# Patient Record
Sex: Female | Born: 1985
Health system: Southern US, Community
[De-identification: ages and names within clinical notes are randomized; demographics above are authoritative.]

## PROBLEM LIST (undated history)

## (undated) DIAGNOSIS — I10 Essential (primary) hypertension: Secondary | ICD-10-CM

## (undated) DIAGNOSIS — E119 Type 2 diabetes mellitus without complications: Secondary | ICD-10-CM

## (undated) HISTORY — DX: Type 2 diabetes mellitus without complications: E11.9

## (undated) HISTORY — PX: APPENDECTOMY: SHX54

## (undated) HISTORY — DX: Essential (primary) hypertension: I10

---

## 2006-04-30 ENCOUNTER — Emergency Department (HOSPITAL_COMMUNITY): Admission: EM | Admit: 2006-04-30 | Discharge: 2006-04-30 | Payer: Self-pay | Admitting: Family Medicine

## 2008-03-24 ENCOUNTER — Emergency Department (HOSPITAL_COMMUNITY): Admission: EM | Admit: 2008-03-24 | Discharge: 2008-03-25 | Payer: Self-pay | Admitting: *Deleted

## 2008-03-30 ENCOUNTER — Emergency Department (HOSPITAL_COMMUNITY): Admission: EM | Admit: 2008-03-30 | Discharge: 2008-03-30 | Payer: Self-pay | Admitting: Family Medicine

## 2008-06-11 ENCOUNTER — Emergency Department (HOSPITAL_COMMUNITY): Admission: EM | Admit: 2008-06-11 | Discharge: 2008-06-11 | Payer: Self-pay | Admitting: Family Medicine

## 2008-09-16 ENCOUNTER — Emergency Department (HOSPITAL_COMMUNITY): Admission: EM | Admit: 2008-09-16 | Discharge: 2008-09-16 | Payer: Self-pay | Admitting: Family Medicine

## 2008-11-09 ENCOUNTER — Emergency Department (HOSPITAL_COMMUNITY): Admission: EM | Admit: 2008-11-09 | Discharge: 2008-11-09 | Payer: Self-pay | Admitting: Emergency Medicine

## 2009-07-23 ENCOUNTER — Ambulatory Visit: Payer: Self-pay | Admitting: Family Medicine

## 2009-07-23 DIAGNOSIS — I10 Essential (primary) hypertension: Secondary | ICD-10-CM | POA: Insufficient documentation

## 2009-07-23 DIAGNOSIS — S139XXA Sprain of joints and ligaments of unspecified parts of neck, initial encounter: Secondary | ICD-10-CM | POA: Insufficient documentation

## 2009-07-23 DIAGNOSIS — M25519 Pain in unspecified shoulder: Secondary | ICD-10-CM | POA: Insufficient documentation

## 2009-07-24 ENCOUNTER — Telehealth: Payer: Self-pay | Admitting: Family Medicine

## 2009-09-09 ENCOUNTER — Emergency Department (HOSPITAL_COMMUNITY): Admission: EM | Admit: 2009-09-09 | Discharge: 2009-09-09 | Payer: Self-pay | Admitting: Family Medicine

## 2010-03-16 ENCOUNTER — Ambulatory Visit (HOSPITAL_COMMUNITY)
Admission: RE | Admit: 2010-03-16 | Discharge: 2010-03-16 | Payer: Self-pay | Source: Home / Self Care | Attending: Obstetrics | Admitting: Obstetrics

## 2010-04-19 NOTE — Progress Notes (Signed)
Summary: Allergic to RX'd Med.  Phone Note From Pharmacy   Reason for Call: Allergy Alert Summary of Call: Pharnacy called and stated that pt. informed them she is allergic to Clyclobenzaprine that was RX'd yesterday. Please advise. Pharmacy # [phone] [facility]. Joanne Chars CMA  Jul 24, 2009 9:15 AM     New/Updated Medications: METHOCARBAMOL 500 MG TABS (METHOCARBAMOL) One-half to one tab by mouth hs Prescriptions: METHOCARBAMOL 500 MG TABS (METHOCARBAMOL) One-half to one tab by mouth hs  #10 (ten) x 3   Entered and Authorized by:   Donna Christen MD   Signed by:   Donna Christen MD on 07/24/2009   Method used:   Electronically to        [facility] [phone]* (retail)       [address]       [place], [place]  [address]       Ph: [phone]       Fax: [phone]   RxID:   [id number]  Discussed with patient; Rx sent. Donna Christen MD  Jul 24, 2009 9:33 AM

## 2010-04-19 NOTE — Assessment & Plan Note (Signed)
Summary: R Shoulder/back pain x 2 wks rm 1   Vital Signs:  Patient Profile:   25 Years Old Female CC:      Shoulder/back pain radiating down arm x  Height:     66 inches Weight:      201 pounds O2 Sat:      99 % O2 treatment:    Room Air Temp:     98.3 degrees F oral Pulse rate:   84 / minute Pulse rhythm:   regular Resp:     16 per minute BP sitting:   138 / 92  (right arm) Cuff size:   regular  Vitals Entered By: Areta Haber CMA (Jul 23, 2009 8:23 AM)                  Current Allergies (reviewed today): ! * VICODEN ! FLEXERIL    History of Present Illness Chief Complaint: Shoulder/back pain radiating down arm x  History of Present Illness: Subjective:  Patient complains of 2 week history of soreness that started in her right neck, gradually spreading to her right shoulder and right shoulder blade.  The pain is worse with movement.  No chest pain.  No cough or pleuritic pain.  She is a smoker.  She recalls no injury but admits that she is in school and carries a heavy book bag on her right shoulder (she has now switched to her left shoulder).  She is having difficulty sleeping.  She has had slight decrease in discomfort when taking ibuprofen.  Current Problems: NECK SPASM (ICD-847.0) SHOULDER PAIN, RIGHT (ICD-719.41) FAMILY HISTORY DIABETES 1ST DEGREE RELATIVE (ICD-V18.0) HYPERTENSION (ICD-401.9)   Current Meds TRIAMTERENE-HCTZ 37.5-25 MG TABS (TRIAMTERENE-HCTZ) 1 tab by mouth once daily NUVARING 0.12-0.015 MG/24HR RING (ETONOGESTREL-ETHINYL ESTRADIOL) 1 tab  by mouth once daily IBUPROFEN 200 MG TABS (IBUPROFEN) 4 tabs by mouth as needed for pain NAPROXEN 500 MG TABS (NAPROXEN) One by mouth two times a day pc CYCLOBENZAPRINE HCL 10 MG TABS (CYCLOBENZAPRINE HCL) One tab by mouth hs  REVIEW OF SYSTEMS Constitutional Symptoms      Denies fever, chills, night sweats, weight loss, weight gain, and fatigue.  Eyes       Denies change in vision, eye pain, eye  discharge, glasses, contact lenses, and eye surgery. Ear/Nose/Throat/Mouth       Denies hearing loss/aids, change in hearing, ear pain, ear discharge, dizziness, frequent runny nose, frequent nose bleeds, sinus problems, sore throat, hoarseness, and tooth pain or bleeding.  Respiratory       Denies dry cough, productive cough, wheezing, shortness of breath, asthma, bronchitis, and emphysema/COPD.  Cardiovascular       Denies murmurs, chest pain, and tires easily with exhertion.    Gastrointestinal       Denies stomach pain, nausea/vomiting, diarrhea, constipation, blood in bowel movements, and indigestion. Genitourniary       Denies painful urination, kidney stones, and loss of urinary control. Neurological       Denies paralysis, seizures, and fainting/blackouts. Musculoskeletal       Complains of decreased range of motion.      Denies muscle pain, joint pain, joint stiffness, redness, swelling, muscle weakness, and gout.      Comments: R Shoulder/back x 2 wks Skin       Denies bruising, unusual mles/lumps or sores, and hair/skin or nail changes.  Psych       Denies mood changes, temper/anger issues, anxiety/stress, speech problems, depression, and sleep problems. Other Comments: Pt  has not seen PCP for this.   Past History:  Past Medical History: Hypertension  Past Surgical History: Appendectomy  Family History: Family History Diabetes 1st degree relative Family History Hypertension Myocardiac  Social History: Single Current Smoker - < 1 pack/2 wks Alcohol use-no Drug use-no Regular exercise-no Smoking Status:  current Drug Use:  no Does Patient Exercise:  no   Objective:  No acute distress  Neck:  No adenopathy.  Full range of motion.  There is tenderness over right trapezius and sternocleidomastoid muscles. Lungs:  Clear to auscultation.  Breath sounds are equal.  Heart:  Regular rate and rhythm without murmurs, rubs, or gallops.  Back:  Distinct tenderness  along medial edge of right scapula Abdomen:  Nontender without masses or hepatosplenomegaly.  Bowel sounds are present.  No CVA or flank tenderness.  Right shoulder:  Has difficulty actively abducting above horizontal.  Has difficulty reaching behind to scratch back.  Has distinct tenderness over medial insertion of biceps tendon.  Distal neurovascular intact  X-ray right shoulder:  no acute findings Chest X-ray:  negative  Assessment New Problems: NECK SPASM (ICD-847.0) SHOULDER PAIN, RIGHT (ICD-719.41) FAMILY HISTORY DIABETES 1ST DEGREE RELATIVE (ICD-V18.0) HYPERTENSION (ICD-401.9)  RIGHT RHOMBOID MUSCLE STRAIN AND INFLAMMATION SUSPECT PRESENT SYMPTOMS ARE A RESULT OF CARRYING A HEAVY BOOK BAG ON RIGHT SHOULDER  Plan New Medications/Changes: CYCLOBENZAPRINE HCL 10 MG TABS (CYCLOBENZAPRINE HCL) One tab by mouth hs  #15 x 1, 07/23/2009, Donna Christen MD NAPROXEN 500 MG TABS (NAPROXEN) One by mouth two times a day pc  #20 x 1, 07/23/2009, Donna Christen MD  New Orders: T-Chest x-ray, 2 views [71020] T-DG Shoulder*R* [73030] New Patient Level III [99203] Planning Comments:   Recomend using a wheeled bag with handle to carry books (or at least a backpack with two shoulder straps) Begin Naproxen.  Muscle relaxant at bedtime.  Begin ice packs.  Begin rehab exercises ([facility] information and instruction patient handouts given)  If not improving two weeks, recommend followup visit with [facility].   The patient and/or caregiver has been counseled thoroughly with regard to medications prescribed including dosage, schedule, interactions, rationale for use, and possible side effects and they verbalize understanding.  Diagnoses and expected course of recovery discussed and will return if not improved as expected or if the condition worsens. Patient and/or caregiver verbalized understanding.  Prescriptions: CYCLOBENZAPRINE HCL 10 MG TABS (CYCLOBENZAPRINE HCL) One tab by  mouth hs  #15 x 1   Entered and Authorized by:   Donna Christen MD   Signed by:   Donna Christen MD on 07/23/2009   Method used:   Print then Give to Patient   RxID:   [id number] NAPROXEN 500 MG TABS (NAPROXEN) One by mouth two times a day pc  #20 x 1   Entered and Authorized by:   Donna Christen MD   Signed by:   Donna Christen MD on 07/23/2009   Method used:   Print then Give to Patient   RxID:   [id number]

## 2010-06-05 LAB — POCT URINALYSIS DIP (DEVICE)
Bilirubin Urine: NEGATIVE
Glucose, UA: NEGATIVE mg/dL
Hgb urine dipstick: NEGATIVE
Ketones, ur: NEGATIVE mg/dL
Protein, ur: NEGATIVE mg/dL
Urobilinogen, UA: 0.2 mg/dL (ref 0.0–1.0)

## 2010-06-05 LAB — WET PREP, GENITAL: Yeast Wet Prep HPF POC: NONE SEEN

## 2010-06-05 LAB — POCT PREGNANCY, URINE: Preg Test, Ur: NEGATIVE

## 2010-06-25 LAB — POCT URINALYSIS DIP (DEVICE)
Glucose, UA: NEGATIVE mg/dL
Hgb urine dipstick: NEGATIVE
Nitrite: NEGATIVE
Urobilinogen, UA: 0.2 mg/dL (ref 0.0–1.0)
pH: 7 (ref 5.0–8.0)

## 2010-06-25 LAB — GC/CHLAMYDIA PROBE AMP, GENITAL: GC Probe Amp, Genital: NEGATIVE

## 2010-06-25 LAB — WET PREP, GENITAL

## 2010-06-25 LAB — POCT PREGNANCY, URINE: Preg Test, Ur: NEGATIVE

## 2010-06-27 LAB — WET PREP, GENITAL
Clue Cells Wet Prep HPF POC: NONE SEEN
Yeast Wet Prep HPF POC: NONE SEEN

## 2010-06-27 LAB — GC/CHLAMYDIA PROBE AMP, GENITAL
Chlamydia, DNA Probe: NEGATIVE
GC Probe Amp, Genital: NEGATIVE

## 2010-06-27 LAB — POCT URINALYSIS DIP (DEVICE)
Bilirubin Urine: NEGATIVE
Protein, ur: NEGATIVE mg/dL
pH: 6 (ref 5.0–8.0)

## 2010-06-27 LAB — POCT PREGNANCY, URINE: Preg Test, Ur: NEGATIVE

## 2010-06-30 LAB — POCT URINALYSIS DIP (DEVICE)
Hgb urine dipstick: NEGATIVE
Protein, ur: NEGATIVE mg/dL
Urobilinogen, UA: 0.2 mg/dL (ref 0.0–1.0)
pH: 7 (ref 5.0–8.0)

## 2010-06-30 LAB — WET PREP, GENITAL: Trich, Wet Prep: NONE SEEN

## 2010-06-30 LAB — GC/CHLAMYDIA PROBE AMP, GENITAL
Chlamydia, DNA Probe: NEGATIVE
GC Probe Amp, Genital: NEGATIVE

## 2010-07-04 LAB — POCT I-STAT, CHEM 8
BUN: 5 mg/dL — ABNORMAL LOW (ref 6–23)
Calcium, Ion: 1.12 mmol/L (ref 1.12–1.32)
HCT: 40 % (ref 36.0–46.0)
Sodium: 143 mEq/L (ref 135–145)
TCO2: 21 mmol/L (ref 0–100)

## 2010-07-04 LAB — URINE MICROSCOPIC-ADD ON

## 2010-07-04 LAB — URINALYSIS, ROUTINE W REFLEX MICROSCOPIC
Glucose, UA: NEGATIVE mg/dL
Protein, ur: 30 mg/dL — AB

## 2010-09-07 ENCOUNTER — Encounter: Payer: Managed Care, Other (non HMO) | Attending: Family Medicine | Admitting: *Deleted

## 2010-09-07 DIAGNOSIS — Z713 Dietary counseling and surveillance: Secondary | ICD-10-CM | POA: Insufficient documentation

## 2010-09-07 DIAGNOSIS — E669 Obesity, unspecified: Secondary | ICD-10-CM | POA: Insufficient documentation

## 2010-09-07 DIAGNOSIS — I10 Essential (primary) hypertension: Secondary | ICD-10-CM | POA: Insufficient documentation

## 2010-10-10 ENCOUNTER — Ambulatory Visit: Payer: Self-pay | Admitting: *Deleted

## 2011-07-13 DIAGNOSIS — F988 Other specified behavioral and emotional disorders with onset usually occurring in childhood and adolescence: Secondary | ICD-10-CM | POA: Insufficient documentation

## 2011-07-27 DIAGNOSIS — E669 Obesity, unspecified: Secondary | ICD-10-CM | POA: Insufficient documentation

## 2011-07-27 DIAGNOSIS — G43109 Migraine with aura, not intractable, without status migrainosus: Secondary | ICD-10-CM | POA: Insufficient documentation

## 2011-09-30 ENCOUNTER — Other Ambulatory Visit: Payer: Self-pay | Admitting: Obstetrics

## 2012-10-15 ENCOUNTER — Encounter: Payer: Self-pay | Admitting: Obstetrics

## 2013-12-22 DIAGNOSIS — F5101 Primary insomnia: Secondary | ICD-10-CM | POA: Insufficient documentation

## 2014-02-03 DIAGNOSIS — E119 Type 2 diabetes mellitus without complications: Secondary | ICD-10-CM | POA: Insufficient documentation

## 2015-03-19 DIAGNOSIS — Z789 Other specified health status: Secondary | ICD-10-CM | POA: Insufficient documentation

## 2016-05-03 DIAGNOSIS — E669 Obesity, unspecified: Secondary | ICD-10-CM | POA: Diagnosis not present

## 2016-05-03 DIAGNOSIS — E119 Type 2 diabetes mellitus without complications: Secondary | ICD-10-CM | POA: Diagnosis not present

## 2016-05-03 MED FILL — GLIMEPIRIDE 2 MG TABLET: 2 | 90 days supply | Qty: 90 | Fill #0

## 2016-05-19 MED FILL — BELVIQ 10 MG TABLET: 10 | 30 days supply | Qty: 60 | Fill #0

## 2016-05-23 DIAGNOSIS — Z6835 Body mass index (BMI) 35.0-35.9, adult: Secondary | ICD-10-CM | POA: Diagnosis not present

## 2016-05-23 DIAGNOSIS — R87612 Low grade squamous intraepithelial lesion on cytologic smear of cervix (LGSIL): Secondary | ICD-10-CM | POA: Diagnosis not present

## 2016-05-23 DIAGNOSIS — Z01419 Encounter for gynecological examination (general) (routine) without abnormal findings: Secondary | ICD-10-CM | POA: Diagnosis not present

## 2016-05-24 MED FILL — HYDROCHLOROTHIAZIDE 25 MG T: 25 | 90 days supply | Qty: 90 | Fill #0

## 2016-05-24 MED FILL — METFORMIN HCL ER 500 MG TAB: 500 | 90 days supply | Qty: 180 | Fill #0

## 2016-05-24 MED FILL — PROPRANOLOL ER 120 MG CAP: 120 | 30 days supply | Qty: 30 | Fill #0

## 2016-05-24 MED FILL — BUTALB-ACETAMIN-CAFF 50-325: 50-325-40 | 7 days supply | Qty: 30 | Fill #0

## 2016-05-24 MED FILL — BENAZEPRIL HCL 5 MG TABLET: 5 | 90 days supply | Qty: 90 | Fill #0

## 2016-06-21 MED FILL — PROPRANOLOL ER 120 MG CAP: 120 | 30 days supply | Qty: 30 | Fill #1

## 2016-06-23 MED FILL — BELVIQ 10 MG TABLET: 10 | 30 days supply | Qty: 60 | Fill #0

## 2016-07-06 DIAGNOSIS — N898 Other specified noninflammatory disorders of vagina: Secondary | ICD-10-CM | POA: Diagnosis not present

## 2016-07-06 DIAGNOSIS — N760 Acute vaginitis: Secondary | ICD-10-CM | POA: Diagnosis not present

## 2016-07-06 DIAGNOSIS — B9689 Other specified bacterial agents as the cause of diseases classified elsewhere: Secondary | ICD-10-CM | POA: Diagnosis not present

## 2016-07-06 DIAGNOSIS — N920 Excessive and frequent menstruation with regular cycle: Secondary | ICD-10-CM | POA: Diagnosis not present

## 2016-07-06 DIAGNOSIS — R109 Unspecified abdominal pain: Secondary | ICD-10-CM | POA: Diagnosis not present

## 2016-07-06 DIAGNOSIS — R1032 Left lower quadrant pain: Secondary | ICD-10-CM | POA: Diagnosis not present

## 2016-07-26 MED FILL — GLIMEPIRIDE 2 MG TABLET: 2 | 90 days supply | Qty: 90 | Fill #0

## 2016-07-27 MED FILL — HYDROCHLOROTHIAZIDE 25 MG T: 25 | 90 days supply | Qty: 90 | Fill #0

## 2016-07-27 MED FILL — PROPRANOLOL ER 120 MG CAP: 120 | 30 days supply | Qty: 30 | Fill #2

## 2016-07-31 DIAGNOSIS — I10 Essential (primary) hypertension: Secondary | ICD-10-CM | POA: Diagnosis not present

## 2016-07-31 DIAGNOSIS — E669 Obesity, unspecified: Secondary | ICD-10-CM | POA: Diagnosis not present

## 2016-07-31 DIAGNOSIS — R5383 Other fatigue: Secondary | ICD-10-CM | POA: Diagnosis not present

## 2016-07-31 DIAGNOSIS — E119 Type 2 diabetes mellitus without complications: Secondary | ICD-10-CM | POA: Diagnosis not present

## 2016-07-31 MED FILL — IBUPROFEN 800 MG TABLET: 800 | 30 days supply | Qty: 90 | Fill #0

## 2016-07-31 MED FILL — BELVIQ 10 MG TABLET: 10 | 30 days supply | Qty: 60 | Fill #0

## 2016-08-01 MED FILL — BUTALB-ACETAMIN-CAFF 50-325: 50-325-40 | 7 days supply | Qty: 30 | Fill #0

## 2016-08-29 MED FILL — PROPRANOLOL ER 120 MG CAP: 120 | 60 days supply | Qty: 60 | Fill #0

## 2016-09-04 ENCOUNTER — Ambulatory Visit: Payer: Managed Care, Other (non HMO) | Admitting: Osteopathic Medicine

## 2016-09-12 ENCOUNTER — Ambulatory Visit (INDEPENDENT_AMBULATORY_CARE_PROVIDER_SITE_OTHER): Payer: 59 | Admitting: Osteopathic Medicine

## 2016-09-12 ENCOUNTER — Encounter: Payer: Self-pay | Admitting: Osteopathic Medicine

## 2016-09-12 VITALS — BP 122/65 | HR 70 | Wt 223.0 lb

## 2016-09-12 DIAGNOSIS — I10 Essential (primary) hypertension: Secondary | ICD-10-CM

## 2016-09-12 DIAGNOSIS — J45990 Exercise induced bronchospasm: Secondary | ICD-10-CM

## 2016-09-12 DIAGNOSIS — E119 Type 2 diabetes mellitus without complications: Secondary | ICD-10-CM | POA: Diagnosis not present

## 2016-09-12 DIAGNOSIS — H52223 Regular astigmatism, bilateral: Secondary | ICD-10-CM | POA: Diagnosis not present

## 2016-09-12 DIAGNOSIS — Z6835 Body mass index (BMI) 35.0-35.9, adult: Secondary | ICD-10-CM

## 2016-09-12 DIAGNOSIS — G43809 Other migraine, not intractable, without status migrainosus: Secondary | ICD-10-CM | POA: Diagnosis not present

## 2016-09-12 MED ORDER — ALBUTEROL SULFATE HFA 108 (90 BASE) MCG/ACT IN AERS: 2.0000 | INHALATION_SPRAY | Freq: Four times a day (QID) | RESPIRATORY_TRACT | 11 refills | Status: DC | PRN

## 2016-09-12 MED ORDER — PROPRANOLOL HCL ER 80 MG PO CP24: 80.0000 mg | ORAL_CAPSULE | Freq: Every day | ORAL | 1 refills | Status: DC

## 2016-09-12 MED ORDER — METFORMIN HCL ER 500 MG PO TB24: 500.0000 mg | ORAL_TABLET | Freq: Every day | ORAL | 11 refills | Status: DC

## 2016-09-12 MED FILL — VENTOLIN HFA 90 MCG INHALER: 108 (90 BAS | 50 days supply | Qty: 36 | Fill #0

## 2016-09-12 MED FILL — PROPRANOLOL ER 80 MG CAP: 80 | 30 days supply | Qty: 30 | Fill #0 | Status: TO

## 2016-09-12 NOTE — Progress Notes (Signed)
HPI: Brittany Anderson is a 31 y.o. female  who presents to [facility] today, 09/12/16,  for chief complaint of:  Chief Complaint  Patient presents with  . Establish Care    DM2: diagnosed 2 years ago, Recent A1c is at goal, would like to get off some medications if possible. Has been diligent about lifestyle/dietary modifications but still struggles with weight loss.  Migraine: Has been on relatively high-dose propranolol for prevention, using Fioricet as needed, has tried Topamax in the past but had side effects from this medication.  Essential hypertension: On benazepril and hydrochlorothiazide, propranolol as above but mostly for migraine prophylaxis. She would like to try to get off of some of these medications if possible.  Dyspnea on exertion: Typically not bothersome but occasionally with strenuous workouts will notice some shortness of breath. No chest pain, no palpitations, no orthopnea or lower extremity swelling    Past medical, surgical, social and family history reviewed: Patient Active Problem List   Diagnosis Date Noted  . HYPERTENSION 07/23/2009  . SHOULDER PAIN, RIGHT 07/23/2009  . NECK SPASM 07/23/2009   No past surgical history on file. Social History  Substance Use Topics  . Smoking status: Not on file  . Smokeless tobacco: Not on file  . Alcohol use Not on file   No family history on file.   Current medication list and allergy/intolerance information reviewed:   Current Outpatient Prescriptions  Medication Sig Dispense Refill  . ALPRAZolam (XANAX) 0.5 MG tablet Take 0.5 mg by mouth.    . benazepril (LOTENSIN) 5 MG tablet Take 5 mg by mouth.    . metFORMIN (GLUCOPHAGE-XR) 500 MG 24 hr tablet Take 1,000 mg by mouth.    . ALPRAZolam (XANAX) 0.5 MG tablet     . BELVIQ 10 MG TABS     . butalbital-acetaminophen-caffeine (FIORICET, ESGIC) 50-325-40 MG tablet     . glimepiride (AMARYL) 2 MG tablet   0  .  hydrochlorothiazide (HYDRODIURIL) 25 MG tablet   0  . ibuprofen (ADVIL,MOTRIN) 800 MG tablet     . Levonorgestrel 13.5 MG IUD by Intrauterine route.    . propranolol ER (INDERAL LA) 120 MG 24 hr capsule   1   No current facility-administered medications for this visit.    Allergies  Allergen Reactions  . Cyclobenzaprine Hcl     REACTION: itching  . Hydrocodone-Acetaminophen     REACTION: itching  . Hydrocodone-Ibuprofen Itching      Review of Systems:  Constitutional:  No  fever, no chills, No recent illness, No unintentional weight changes. No significant fatigue.   HEENT: No  headache, no vision change  Cardiac: No  chest pain, No  pressure, No palpitations, No  Orthopnea   Respiratory:  No  shortness of breath at this time. No  Cough  Gastrointestinal: No  abdominal pain, No  nausea, No  vomiting,  Musculoskeletal: No new myalgia/arthralgia  Skin: No  Rash  Endocrine: No cold intolerance,  No heat intolerance. No polyuria/polydipsia/polyphagia   Neurologic: No  weakness, No  dizziness,  Psychiatric: No  concerns with depression, No  concerns with anxiety, No sleep problems, No mood problems  Exam:  BP 122/65   Pulse 70   Wt 223 lb (101.2 kg)   BMI 35.99 kg/m   Constitutional: VS see above. General Appearance: alert, well-developed, well-nourished, NAD  Eyes: Normal lids and conjunctive, non-icteric sclera  Ears, Nose, Mouth, Throat: MMM, Normal external inspection ears/nares/mouth/lips/gums.   Neck: No  masses, trachea midline. No thyroid enlargement. No tenderness/mass appreciated. No lymphadenopathy  Respiratory: Normal respiratory effort. no wheeze, no rhonchi, no rales  Cardiovascular: S1/S2 normal, no murmur, no rub/gallop auscultated. RRR. No lower extremity edema.   Musculoskeletal: Gait normal. No clubbing/cyanosis of digits.   Neurological: Normal balance/coordination. No tremor  Skin: warm, dry, intact.   Psychiatric: Normal  judgment/insight. Normal mood and affect. Oriented x3.   Labs reviewed [facility] system: 07/31/2016 No concerns on CMP, A1c was 6.0, down from 6.8 and 7.2 last year, TSH normal Lipid profile 06/10/2015: Elevated triglycerides but otherwise no concerns.   ASSESSMENT/PLAN:   Type 2 diabetes mellitus without complication, without long-term current use of insulin (HCC) - I think based on progress with A1c and weight loss, can de-escalate therapy and follow-up with A1c. Recommended continued use metformin per guidelines  Other migraine without status migrainosus, not intractable - Patient would like to try weaning of propranolol and follow-up with neurologist - Plan: propranolol ER (INDERAL LA) 80 MG 24 hr capsule, Ambulatory referral to Neurology  Essential hypertension - Continue current medications, monitor blood pressure closely since we are decreasing dose of propranolol  BMI 35.0-35.9,adult  Exercise-induced asthma - Trial albuterol prior to exercise, if no improvement, consider further workup.    Patient Instructions  Thanks for coming to see [place] today!  Plan:  Can stop the Amaryl  Can decrease the Metformin to 500 mg daily  Can decrease the Propranolol to 80 mg daily for a few weeks and stop  Try Albuterol prior to exercise   Let us know if you don't hear about neurology referral within one week  For refills - see if the pharmacy can route [place] a request, or call our office directly     Visit summary with medication list and pertinent instructions was printed for patient to review. All questions at time of visit were answered - patient instructed to contact office with any additional concerns. ER/RTC precautions were reviewed with the patient. Follow-up plan: Return for diabetes recheck after 11/01/16.

## 2016-09-12 NOTE — Patient Instructions (Addendum)
Thanks for coming to see Brittany Anderson today!  Plan:  Can stop the Amaryl  Can decrease the Metformin to 500 mg daily  Can decrease the Propranolol to 80 mg daily for a few weeks and stop  Try Albuterol prior to exercise   Let Brittany Anderson know if you don't hear about neurology referral within one week  For refills - see if the pharmacy can route Brittany Anderson a request, or call our office directly

## 2016-09-13 DIAGNOSIS — Z6835 Body mass index (BMI) 35.0-35.9, adult: Secondary | ICD-10-CM | POA: Insufficient documentation

## 2016-09-13 DIAGNOSIS — J45990 Exercise induced bronchospasm: Secondary | ICD-10-CM | POA: Insufficient documentation

## 2016-09-21 ENCOUNTER — Other Ambulatory Visit: Payer: Self-pay

## 2016-09-21 ENCOUNTER — Telehealth: Payer: Self-pay

## 2016-09-21 MED ORDER — METFORMIN HCL ER 500 MG PO TB24: 500.0000 mg | ORAL_TABLET | Freq: Every day | ORAL | 5 refills | Status: DC

## 2016-09-21 NOTE — Telephone Encounter (Signed)
Pt called and stated that her refill for metformin 500 mg was not sent to the pharmacy a week ago. Called pharmacy and verified that they did not receive refill that was sent on 09/12/16. Changed Rx to 5 refills per Dr. Lyn HollingsheadAlexander. Pt also wants to know if she can get a refill on her xanax 0.5 mg? Prescription was previously prescribed by another provider. Please Advise?

## 2016-09-22 MED ORDER — ALPRAZOLAM 0.5 MG PO TABS: 0.5000 mg | ORAL_TABLET | Freq: Every day | ORAL | 0 refills | Status: DC | PRN

## 2016-09-22 NOTE — Telephone Encounter (Signed)
Rx faxed to [facility] per Dr. Lyn HollingsheadAlexander.

## 2016-09-22 NOTE — Telephone Encounter (Signed)
LVM requesting pt to call the office.  

## 2016-09-22 NOTE — Telephone Encounter (Signed)
We did not discuss this medication in detail at her most recent visit. Okay to send refills for now but will discuss this medication more at her follow-up visit since this is typically not something that I prescribe for patients to be using on a daily basis. Please confirm pharmacy with patient and we can fax the medicine, it is in my medical assistants in basket

## 2016-09-22 NOTE — Telephone Encounter (Signed)
Pt informed. Pt expressed understanding and is agreeable. [facility] Canc[facility] Rx at [facility] and [facility]. Liborio NixonAmanda Marvelle Span CMA, RT

## 2016-09-26 ENCOUNTER — Emergency Department
Admission: EM | Admit: 2016-09-26 | Discharge: 2016-09-26 | Discharge status: Absent without leave | Payer: Self-pay | Source: Home / Self Care

## 2016-09-28 ENCOUNTER — Encounter (HOSPITAL_BASED_OUTPATIENT_CLINIC_OR_DEPARTMENT_OTHER): Payer: Self-pay | Admitting: *Deleted

## 2016-09-28 ENCOUNTER — Emergency Department (HOSPITAL_BASED_OUTPATIENT_CLINIC_OR_DEPARTMENT_OTHER)
Admission: EM | Admit: 2016-09-28 | Discharge: 2016-09-28 | Disposition: A | Discharge status: Home / Self Care | Payer: 59 | Attending: Emergency Medicine | Admitting: Emergency Medicine

## 2016-09-28 ENCOUNTER — Emergency Department (HOSPITAL_BASED_OUTPATIENT_CLINIC_OR_DEPARTMENT_OTHER): Payer: 59

## 2016-09-28 DIAGNOSIS — Z7984 Long term (current) use of oral hypoglycemic drugs: Secondary | ICD-10-CM | POA: Insufficient documentation

## 2016-09-28 DIAGNOSIS — J45909 Unspecified asthma, uncomplicated: Secondary | ICD-10-CM | POA: Insufficient documentation

## 2016-09-28 DIAGNOSIS — Y999 Unspecified external cause status: Secondary | ICD-10-CM | POA: Insufficient documentation

## 2016-09-28 DIAGNOSIS — I10 Essential (primary) hypertension: Secondary | ICD-10-CM | POA: Diagnosis not present

## 2016-09-28 DIAGNOSIS — Y929 Unspecified place or not applicable: Secondary | ICD-10-CM | POA: Insufficient documentation

## 2016-09-28 DIAGNOSIS — M79642 Pain in left hand: Secondary | ICD-10-CM | POA: Diagnosis not present

## 2016-09-28 DIAGNOSIS — E119 Type 2 diabetes mellitus without complications: Secondary | ICD-10-CM | POA: Diagnosis not present

## 2016-09-28 DIAGNOSIS — Z87891 Personal history of nicotine dependence: Secondary | ICD-10-CM | POA: Insufficient documentation

## 2016-09-28 DIAGNOSIS — S6992XA Unspecified injury of left wrist, hand and finger(s), initial encounter: Secondary | ICD-10-CM | POA: Diagnosis not present

## 2016-09-28 DIAGNOSIS — Z79899 Other long term (current) drug therapy: Secondary | ICD-10-CM | POA: Insufficient documentation

## 2016-09-28 DIAGNOSIS — Y939 Activity, unspecified: Secondary | ICD-10-CM | POA: Insufficient documentation

## 2016-09-28 DIAGNOSIS — R52 Pain, unspecified: Secondary | ICD-10-CM

## 2016-09-28 MED FILL — METFORMIN HCL ER 500 MG TAB: 500 | 30 days supply | Qty: 30 | Fill #0
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
## 2016-09-28 NOTE — ED Notes (Signed)
Patient was seen and assessed prior to this RN seeing patient. When patient in to assess the patient she states that she needs to go to the pharmacy to get her meds. Patient to return. A/O x 3 - no distress noted
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
## 2016-09-28 NOTE — Discharge Instructions (Signed)
Your x-ray does not show fracture. Please take ibuprofen and Tylenol as needed for pain.

## 2016-09-29 ENCOUNTER — Telehealth: Payer: Self-pay | Admitting: Osteopathic Medicine

## 2016-09-29 NOTE — Telephone Encounter (Signed)
Paperwork completed and placed in fax box.

## 2016-10-01 ENCOUNTER — Emergency Department (INDEPENDENT_AMBULATORY_CARE_PROVIDER_SITE_OTHER)
Admission: EM | Admit: 2016-10-01 | Discharge: 2016-10-01 | Disposition: A | Discharge status: Home / Self Care | Payer: 59 | Source: Home / Self Care | Attending: Family Medicine | Admitting: Family Medicine

## 2016-10-01 ENCOUNTER — Emergency Department (INDEPENDENT_AMBULATORY_CARE_PROVIDER_SITE_OTHER): Payer: 59

## 2016-10-01 ENCOUNTER — Encounter: Payer: Self-pay | Admitting: Emergency Medicine

## 2016-10-01 DIAGNOSIS — S59902A Unspecified injury of left elbow, initial encounter: Secondary | ICD-10-CM | POA: Diagnosis not present

## 2016-10-01 DIAGNOSIS — M25531 Pain in right wrist: Secondary | ICD-10-CM | POA: Diagnosis not present

## 2016-10-01 DIAGNOSIS — M25532 Pain in left wrist: Secondary | ICD-10-CM | POA: Diagnosis not present

## 2016-10-01 DIAGNOSIS — S6992XA Unspecified injury of left wrist, hand and finger(s), initial encounter: Secondary | ICD-10-CM | POA: Diagnosis not present

## 2016-10-01 DIAGNOSIS — M79642 Pain in left hand: Secondary | ICD-10-CM | POA: Diagnosis not present

## 2016-10-01 DIAGNOSIS — M25522 Pain in left elbow: Secondary | ICD-10-CM | POA: Diagnosis not present

## 2016-10-01 DIAGNOSIS — S63502D Unspecified sprain of left wrist, subsequent encounter: Secondary | ICD-10-CM
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
## 2016-10-01 NOTE — Discharge Instructions (Signed)
°  You may take 500mg  acetaminophen every 4-6 hours or in combination with ibuprofen 400-600mg  every 6-8 hours as needed for swelling and pain.  Avoid over use of Left hand/wrist and avoid heavy lifting until pain has resolved.  It is recommended that you were your wrist splint at all times except to bath, wash hands or ice your hand/wrist 2-3 times daily for 15-20 minutes at a time.  You may do gentle range of motion exercises as tolerated. Avoid grasp, lifting, pulling, or carrying with Left hand. Avoid zip-lining rope climbing or rope swinging if still having pain as this may worsen your wrist sprain.   If still having pain in 1-2 weeks, please call to schedule a follow up appointment with Sports Medicine, Dr. Benjamin Stainhekkekandam or Dr. Denyse Amassorey, in the same office as your primary care provider, Dr. Lyn HollingsheadAlexander as they may order an MRI if they feel it is indicated at that time.

## 2016-10-01 NOTE — ED Notes (Signed)
Thumb Spica Splint applied to left wrist, discharge instructions discussed with patient. She was concerned due to leaving for a cruise tomorrow. She states she is supposed to zip line on this trip. Patient was encouraged to follow all discharge instructions. She is also to take Tylenol/Ibuprofen on a regular regimen. Follow RICE,

## 2016-10-01 NOTE — ED Notes (Signed)
Pt escorted to xray.

## 2016-10-10 ENCOUNTER — Ambulatory Visit (INDEPENDENT_AMBULATORY_CARE_PROVIDER_SITE_OTHER): Payer: 59 | Admitting: Family Medicine

## 2016-10-10 ENCOUNTER — Encounter: Payer: Self-pay | Admitting: Family Medicine

## 2016-10-10 VITALS — BP 140/81 | HR 87 | Wt 233.0 lb

## 2016-10-10 DIAGNOSIS — S6992XA Unspecified injury of left wrist, hand and finger(s), initial encounter: Secondary | ICD-10-CM | POA: Diagnosis not present

## 2016-10-10 MED ORDER — DICLOFENAC SODIUM 1 % TD GEL: 2.0000 g | Freq: Four times a day (QID) | TRANSDERMAL | 11 refills | Status: DC

## 2016-10-10 NOTE — Patient Instructions (Addendum)
Thank you for coming in today. Use the brace as needed.  Attend Hand Therapy.   Use Voltaren Gel as needed up tp 4x daily (q6h).  Recheck in 3-4 weeks if not better.
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
## 2016-10-12 ENCOUNTER — Institutional Professional Consult (permissible substitution): Payer: 59 | Admitting: Family Medicine

## 2016-10-13 MED FILL — PROPRANOLOL ER 80 MG CAP: 80 | 30 days supply | Qty: 30 | Fill #0

## 2016-10-19 ENCOUNTER — Ambulatory Visit: Payer: Managed Care, Other (non HMO) | Admitting: Family Medicine

## 2016-11-02 ENCOUNTER — Encounter: Payer: Self-pay | Admitting: Physician Assistant

## 2016-11-02 ENCOUNTER — Ambulatory Visit (INDEPENDENT_AMBULATORY_CARE_PROVIDER_SITE_OTHER): Payer: 59 | Admitting: Physician Assistant

## 2016-11-02 ENCOUNTER — Ambulatory Visit: Payer: Managed Care, Other (non HMO) | Admitting: Osteopathic Medicine

## 2016-11-02 VITALS — BP 121/81 | HR 77 | Ht 65.5 in | Wt 233.0 lb

## 2016-11-02 DIAGNOSIS — I10 Essential (primary) hypertension: Secondary | ICD-10-CM | POA: Diagnosis not present

## 2016-11-02 DIAGNOSIS — F419 Anxiety disorder, unspecified: Secondary | ICD-10-CM

## 2016-11-02 DIAGNOSIS — Z13220 Encounter for screening for lipoid disorders: Secondary | ICD-10-CM

## 2016-11-02 DIAGNOSIS — G43809 Other migraine, not intractable, without status migrainosus: Secondary | ICD-10-CM

## 2016-11-02 DIAGNOSIS — Z136 Encounter for screening for cardiovascular disorders: Secondary | ICD-10-CM | POA: Diagnosis not present

## 2016-11-02 DIAGNOSIS — E119 Type 2 diabetes mellitus without complications: Secondary | ICD-10-CM | POA: Diagnosis not present

## 2016-11-02 LAB — POCT UA - MICROALBUMIN
Albumin/Creatinine Ratio, Urine, POC: <30
Creatinine, POC: 50 mg/dL
MICROALBUMIN (UR) POC: 10 mg/L

## 2016-11-02 LAB — POCT GLYCOSYLATED HEMOGLOBIN (HGB A1C): HEMOGLOBIN A1C: 8.1

## 2016-11-02 MED ORDER — METFORMIN HCL ER 500 MG PO TB24: 1000.0000 mg | ORAL_TABLET | Freq: Every day | ORAL | 0 refills | Status: DC

## 2016-11-02 MED ORDER — PROPRANOLOL HCL ER 80 MG PO CP24: 80.0000 mg | ORAL_CAPSULE | Freq: Every day | ORAL | 0 refills | Status: DC

## 2016-11-02 MED ORDER — ONDANSETRON 4 MG PO TBDP
4.0000 mg | ORAL_TABLET | Freq: Four times a day (QID) | ORAL | 0 refills | Status: DC | PRN
Start: 2016-11-02 — End: 2017-05-21

## 2016-11-02 MED ORDER — ALPRAZOLAM 0.5 MG PO TABS: 0.5000 mg | ORAL_TABLET | Freq: Every day | ORAL | 0 refills | Status: DC | PRN

## 2016-11-02 MED ORDER — HYDROCHLOROTHIAZIDE 25 MG PO TABS: 25.0000 mg | ORAL_TABLET | Freq: Every day | ORAL | 0 refills | Status: DC

## 2016-11-02 MED ORDER — LIRAGLUTIDE 18 MG/3ML ~~LOC~~ SOPN: PEN_INJECTOR | SUBCUTANEOUS | 1 refills | Status: DC

## 2016-11-02 MED FILL — HYDROCHLOROTHIAZIDE 25 MG T: 25 | 90 days supply | Qty: 90 | Fill #0

## 2016-11-02 MED FILL — ONDANSETRON ODT 4 MG TABLET: 4 | 4 days supply | Qty: 16 | Fill #0

## 2016-11-02 MED FILL — METFORMIN HCL ER 500 MG TAB: 500 | 90 days supply | Qty: 180 | Fill #0

## 2016-11-02 NOTE — Patient Instructions (Signed)
- Increase your Metformin to 2 tabs daily with breakfast or dinner - Start Victoza - Limit and count your carbohydrates - Increase physical activity to at least 3, 45 minute sessions of cardio per week - Return in 3 months     Carbohydrate Counting for Diabetes Mellitus, Adult Carbohydrate counting is a method for keeping track of how many carbohydrates you eat. Eating carbohydrates naturally increases the amount of sugar (glucose) in the blood. Counting how many carbohydrates you eat helps keep your blood glucose within normal limits, which helps you manage your diabetes (diabetes mellitus). It is important to know how many carbohydrates you can safely have in each meal. This is different for every person. A diet and nutrition specialist (registered dietitian) can help you make a meal plan and calculate how many carbohydrates you should have at each meal and snack. Carbohydrates are found in the following foods:  Grains, such as breads and cereals.  Dried beans and soy products.  Starchy vegetables, such as potatoes, peas, and corn.  Fruit and fruit juices.  Milk and yogurt.  Sweets and snack foods, such as cake, cookies, candy, chips, and soft drinks.  How do I count carbohydrates? There are two ways to count carbohydrates in food. You can use either of the methods or a combination of both. Reading "Nutrition Facts" on packaged food The "Nutrition Facts" list is included on the labels of almost all packaged foods and beverages in the [place] It includes:  The serving size.  Information about nutrients in each serving, including the grams (g) of carbohydrate per serving.  To use the "Nutrition Facts":  Decide how many servings you will have.  Multiply the number of servings by the number of carbohydrates per serving.  The resulting number is the total amount of carbohydrates that you will be having.  Learning standard serving sizes of other foods When you eat foods containing  carbohydrates that are not packaged or do not include "Nutrition Facts" on the label, you need to measure the servings in order to count the amount of carbohydrates:  Measure the foods that you will eat with a food scale or measuring cup, if needed.  Decide how many standard-size servings you will eat.  Multiply the number of servings by 15. Most carbohydrate-rich foods have about 15 g of carbohydrates per serving. ? For example, if you eat 8 oz (170 g) of strawberries, you will have eaten 2 servings and 30 g of carbohydrates (2 servings x 15 g = 30 g).  For foods that have more than one food mixed, such as soups and casseroles, you must count the carbohydrates in each food that is included.  The following list contains standard serving sizes of common carbohydrate-rich foods. Each of these servings has about 15 g of carbohydrates:   hamburger bun or  English muffin.   oz (15 mL) syrup.   oz (14 g) jelly.  1 slice of bread.  1 six-inch tortilla.  3 oz (85 g) cooked rice or pasta.  4 oz (113 g) cooked dried beans.  4 oz (113 g) starchy vegetable, such as peas, corn, or potatoes.  4 oz (113 g) hot cereal.  4 oz (113 g) mashed potatoes or  of a large baked potato.  4 oz (113 g) canned or frozen fruit.  4 oz (120 mL) fruit juice.  4-6 crackers.  6 chicken nuggets.  6 oz (170 g) unsweetened dry cereal.  6 oz (170 g) plain fat-free yogurt or  yogurt sweetened with artificial sweeteners.  8 oz (240 mL) milk.  8 oz (170 g) fresh fruit or one small piece of fruit.  24 oz (680 g) popped popcorn.  Example of carbohydrate counting Sample meal  3 oz (85 g) chicken breast.  6 oz (170 g) brown rice.  4 oz (113 g) corn.  8 oz (240 mL) milk.  8 oz (170 g) strawberries with sugar-free whipped topping. Carbohydrate calculation 1. Identify the foods that contain carbohydrates: ? Rice. ? Corn. ? Milk. ? Strawberries. 2. Calculate how many servings you have of each  food: ? 2 servings rice. ? 1 serving corn. ? 1 serving milk. ? 1 serving strawberries. 3. Multiply each number of servings by 15 g: ? 2 servings rice x 15 g = 30 g. ? 1 serving corn x 15 g = 15 g. ? 1 serving milk x 15 g = 15 g. ? 1 serving strawberries x 15 g = 15 g. 4. Add together all of the amounts to find the total grams of carbohydrates eaten: ? 30 g + 15 g + 15 g + 15 g = 75 g of carbohydrates total. This information is not intended to replace advice given to you by your health care provider. Make sure you discuss any questions you have with your health care provider. Document Released: 03/06/2005 Document Revised: 09/24/2015 Document Reviewed: 08/18/2015 Elsevier Interactive Patient Education  [employer]2018 [employer] Inc.

## 2016-11-02 NOTE — Progress Notes (Signed)
HPI:                                                                Brittany Anderson is a 31 y.o. female who [facility]: Primary Care Sports Medicine today for diabetes, hypertension and migraine follow-up  DMII: taking Glucophage XR 500mg  daily. Compliant with medications. Last A1C 6.0, 07/2016. Diagnosed in 2015. Does not check blood sugars at home.  Denies polyuria, vision change, and paresthesias. Denies hypoglycemic events. Denies ulcers/wounds on feet. [facility]  HTN: taking hydrochlorothiazde daily. Compliant with medications. Does not check BP's at home.Denies vision change, headache, chest pain with exertion, orthopnea, lightheadedness, syncope and edema. Risk factors include: obesity, diabetes  Migraine: doing well on Propranolol ER. Reports she has had more headaches since reducing the dose.   Past Medical History:  Diagnosis Date  . Diabetes mellitus without complication (HCC)   . Hypertension    Past Surgical History:  Procedure Laterality Date  . APPENDECTOMY     Social History  Substance Use Topics  . Smoking status: Former Smoker    Quit date: 2009  . Smokeless tobacco: Never Used  . Alcohol use Yes   family history includes Alcohol abuse in her maternal grandfather; Depression in her maternal grandfather and mother; Diabetes in her father and mother; Hypertension in her father, maternal grandmother, and mother; Stroke in her maternal grandfather.  ROS: negative except as noted in the HPI  Medications: Current Outpatient Prescriptions  Medication Sig Dispense Refill  . albuterol (PROVENTIL HFA;VENTOLIN HFA) 108 (90 Base) MCG/ACT inhaler Inhale 2 puffs into the lungs every 6 (six) hours as needed for wheezing or shortness of breath (and prior to exercise). 2 Inhaler 11  . ALPRAZolam (XANAX) 0.5 MG tablet Take 1 tablet (0.5 mg total) by mouth daily as needed for anxiety. #30 for thirty days 30 tablet 0   . butalbital-acetaminophen-caffeine (FIORICET, ESGIC) 50-325-40 MG tablet     . hydrochlorothiazide (HYDRODIURIL) 25 MG tablet   0  . ibuprofen (ADVIL,MOTRIN) 800 MG tablet     . metFORMIN (GLUCOPHAGE-XR) 500 MG 24 hr tablet Take 1 tablet (500 mg total) by mouth daily with breakfast. 30 tablet 5  . propranolol ER (INDERAL LA) 80 MG 24 hr capsule Take 1 capsule (80 mg total) by mouth daily. 30 capsule 1   No current facility-administered medications for this visit.    Allergies  Allergen Reactions  . Cyclobenzaprine Hcl     REACTION: itching  . Hydrocodone-Acetaminophen     REACTION: itching  . Hydrocodone-Ibuprofen Itching     Objective:  BP 121/81   Pulse 77   Ht 5' 5.5" (1.664 m)   Wt 233 lb (105.7 kg)   BMI 38.18 kg/m  Gen:  alert, not ill-appearing, no distress, appropriate for age HEENT: head normocephalic without obvious abnormality, conjunctiva and cornea clear, trachea midline Pulm: Normal work of breathing, normal phonation Cardio: normal rate, regular rhythm, s1 and s2 distinct, no murmur, click or rub Neuro: alert and oriented x 3, no tremor MSK: extremities atraumatic, normal gait and station, no peripheral edema Skin: intact, no rashes on exposed skin, no jaundice, no cyanosis Psych: well-groomed, cooperative, good eye contact, euthymic mood, affect mood-congruent, speech is  articulate, and thought processes clear and goal-directed    Results for orders placed or performed in visit on 11/02/16 (from the past 72 hour(s))  POCT HgB A1C     Status: Abnormal   Collection Time: 11/02/16  9:24 AM  Result Value Ref Range   Hemoglobin A1C 8.1    No results found.  GAD 7 : Generalized Anxiety Score 11/02/2016  Nervous, Anxious, on Edge 1  Control/stop worrying 0  Worry too much - different things 0  Trouble relaxing 3  Restless 0  Easily annoyed or irritable 1  Afraid - awful might happen 0  Total GAD 7 Score 5      Assessment and Plan: 31 y.o. female  with   1. Type 2 diabetes mellitus without complication, without long-term current use of insulin (HCC) - A1c 8.1, worsened from 6.0, 3 months ago - POCT UA - Microalbumin normal - increasing Metformin XR to 1000mg . Adding Liraglutide. Follow-up in 3 months - metFORMIN (GLUCOPHAGE-XR) 500 MG 24 hr tablet; Take 2 tablets (1,000 mg total) by mouth daily with breakfast.  Dispense: 180 tablet; Refill: 0 - liraglutide 18 MG/3ML SOPN; 0.6 mg SQ once daily for 1 week; then increase to 1.2 mg SQ once daily  Dispense: 4 pen; Refill: 1 - ondansetron (ZOFRAN-ODT) 4 MG disintegrating tablet; Take 1 tablet (4 mg total) by mouth every 6 (six) hours as needed for nausea or vomiting.  Dispense: 16 tablet; Refill: 0 - Lipid Panel w/reflex Direct LDL - Comprehensive metabolic panel   2. Other migraine without status migrainosus, not intractable - propranolol ER (INDERAL LA) 80 MG 24 hr capsule; Take 1 capsule (80 mg total) by mouth daily.  Dispense: 90 capsule; Refill: 0  3. Anxiety disorder, unspecified type - GAD7 score 5, mild - ALPRAZolam (XANAX) 0.5 MG tablet; Take 1 tablet (0.5 mg total) by mouth daily as needed for anxiety. #30 for thirty days  Dispense: 30 tablet; Refill: 0  4. Hypertension goal BP (blood pressure) < 130/80 BP Readings from Last 3 Encounters:  11/02/16 121/81  10/10/16 140/81  10/01/16 (!) 141/92  - BP in range today. Continue daily meds - therapeutic lifestyle changes - hydrochlorothiazide (HYDRODIURIL) 25 MG tablet; Take 1 tablet (25 mg total) by mouth daily.  Dispense: 90 tablet; Refill: 0  5. Encounter for lipid screening for cardiovascular disease - goal LDL <70 for diabetic - Lipid Panel w/reflex Direct LDL   Patient education and anticipatory guidance given Patient agrees with treatment plan Follow-up in 3 months or sooner as needed if symptoms worsen or fail to improve  Levonne Hubert PA-C

## 2016-11-03 MED FILL — VICTOZA 18 MG/3 ML INJECT P: 18 | 90 days supply | Qty: 18 | Fill #0

## 2016-11-05 DIAGNOSIS — F419 Anxiety disorder, unspecified: Secondary | ICD-10-CM | POA: Insufficient documentation

## 2016-11-10 MED FILL — UNIFINE PENTIPS 6MM 31G: 31G X 6 MM | 90 days supply | Qty: 100 | Fill #0

## 2016-11-10 MED FILL — PROPRANOLOL ER 80 MG CAP: 80 | 90 days supply | Qty: 90 | Fill #0

## 2016-11-29 ENCOUNTER — Ambulatory Visit: Payer: Self-pay | Admitting: Neurology

## 2016-12-03 ENCOUNTER — Other Ambulatory Visit: Payer: Self-pay | Admitting: Physician Assistant

## 2016-12-03 DIAGNOSIS — F419 Anxiety disorder, unspecified: Secondary | ICD-10-CM

## 2017-01-08 ENCOUNTER — Other Ambulatory Visit: Payer: Self-pay | Admitting: Osteopathic Medicine

## 2017-01-08 DIAGNOSIS — F419 Anxiety disorder, unspecified: Secondary | ICD-10-CM

## 2017-02-01 ENCOUNTER — Ambulatory Visit (INDEPENDENT_AMBULATORY_CARE_PROVIDER_SITE_OTHER): Payer: 59 | Admitting: Osteopathic Medicine

## 2017-02-01 ENCOUNTER — Encounter: Payer: Self-pay | Admitting: Osteopathic Medicine

## 2017-02-01 VITALS — BP 129/80 | HR 84 | Ht 67.0 in | Wt 245.0 lb

## 2017-02-01 DIAGNOSIS — E119 Type 2 diabetes mellitus without complications: Secondary | ICD-10-CM

## 2017-02-01 DIAGNOSIS — G43809 Other migraine, not intractable, without status migrainosus: Secondary | ICD-10-CM

## 2017-02-01 DIAGNOSIS — I10 Essential (primary) hypertension: Secondary | ICD-10-CM | POA: Diagnosis not present

## 2017-02-01 DIAGNOSIS — R81 Glycosuria: Secondary | ICD-10-CM

## 2017-02-01 LAB — POCT GLYCOSYLATED HEMOGLOBIN (HGB A1C): Hemoglobin A1C: 7.8

## 2017-02-01 MED ORDER — HYDROCHLOROTHIAZIDE 25 MG PO TABS: 25.0000 mg | ORAL_TABLET | Freq: Every day | ORAL | 0 refills | Status: DC

## 2017-02-01 MED ORDER — PROPRANOLOL HCL ER 80 MG PO CP24: 80.0000 mg | ORAL_CAPSULE | Freq: Every day | ORAL | 0 refills | Status: DC

## 2017-02-01 MED ORDER — PROPRANOLOL HCL ER 80 MG PO CP24: 80.0000 mg | ORAL_CAPSULE | Freq: Every day | ORAL | 3 refills | Status: DC

## 2017-02-01 MED ORDER — HYDROCHLOROTHIAZIDE 25 MG PO TABS: 25.0000 mg | ORAL_TABLET | Freq: Every day | ORAL | 3 refills | Status: DC

## 2017-02-01 MED ORDER — METFORMIN HCL ER 500 MG PO TB24: 1000.0000 mg | ORAL_TABLET | Freq: Every day | ORAL | 0 refills | Status: DC

## 2017-02-01 MED ORDER — LORCASERIN HCL 10 MG PO TABS: 10.0000 mg | ORAL_TABLET | Freq: Two times a day (BID) | ORAL | 1 refills | Status: DC

## 2017-02-01 MED ORDER — IBUPROFEN 800 MG PO TABS: 800.0000 mg | ORAL_TABLET | Freq: Four times a day (QID) | ORAL | 1 refills | Status: DC | PRN

## 2017-02-01 MED ORDER — METFORMIN HCL ER 500 MG PO TB24: 1000.0000 mg | ORAL_TABLET | Freq: Every day | ORAL | 3 refills | Status: DC

## 2017-02-01 MED ORDER — GLIMEPIRIDE 2 MG PO TABS: 2.0000 mg | ORAL_TABLET | Freq: Every day | ORAL | 1 refills | Status: DC

## 2017-02-01 MED FILL — PROPRANOLOL ER 80 MG CAP: 80 | 90 days supply | Qty: 90 | Fill #0

## 2017-02-01 MED FILL — HYDROCHLOROTHIAZIDE 25 MG T: 25 | 90 days supply | Qty: 90 | Fill #0

## 2017-02-01 MED FILL — IBUPROFEN 800 MG TAB: 800 | 23 days supply | Qty: 90 | Fill #0

## 2017-02-01 MED FILL — GLIMEPIRIDE 2 MG TABLET: 2 | 90 days supply | Qty: 90 | Fill #0

## 2017-02-01 MED FILL — METFORMIN HCL ER 500 MG TAB: 500 | 90 days supply | Qty: 180 | Fill #0
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
## 2017-02-01 NOTE — Patient Instructions (Signed)
Weight loss: important things to remember  It is hard work! You will have setbacks, but don't get discouraged. The goal is not short-term success, it is long-term health.   Looking at the numbers is important to track your progress and set goals, but how you are feeling and your overall health are the most important things! BMI and pounds and calories and miles logged aren't everything - they are tools to help us reach your goals.  You can do this!!!   Things to remember for exercise for weight loss:   Please note - I am not a certified personal trainer. I can present you with ideas and general workout goals, but an exercise program is largely up to you. Find something you can stick with, and something you enjoy!   As you progress in your exercise regimen think about gradually increasing the following, week by week:   intensity (how strenuous is your workout)  frequency (how often you are exercising)  duration (how many minutes at a time you are exercising)  Walking for 20 minutes a day is certainly better than nothing, but more strenuous exercise will develop better cardiovascular fitness.   interval training (high-intensity alternating with low-intensity, think walk/jog rather than just walk)  muscle strengthening exercises (weight lifting, calisthenics, yoga) - this also helps prevent osteoporosis!   Things to remember for diet changes for weight loss:   Please note - I am not a certified dietician. I can present you with ideas and general diet goals, but a meal plan is largely up to you. I am happy to refer you to a dietician who can give you a detailed meal plan.  Apps/logs are crucial to track how you're eating! It's not realistic to be logging everything you eat forever, but when you're starting a healthy eating lifestyle it's very helpful, and checking in with logs now and then helps you stick to your program!   Calorie restriction with the goal weight loss of no more than one  to one and a half pounds per week.   Increase lean protein such as chicken, fish, turkey.   Decrease fatty foods such as dairy, butter.   Decrease sugary foods. Avoid sugary drinks such as soda or juice.  Increase fiber found in fruit and vegetables.   Medications approved for long-term use for obesity  Qsymia (Phentermine and Topiramate)  Saxenda (Liraglutide 3 mg/day)  Contrave (Bupropion and Naltrexone)  Lorcaserin (Belviq or Belviq XR)  Orlistat (Xenical, Alli)  Bupropion (Wellbutrin) I recommend that you research the above medications and see which one(s) your insurance may or may not cover: If you call your insurance, ask them specifically what medications are on their formulary that are approved for obesity treatment. They should be able to send you a list or tell you over the phone. Remember, medications aren't magic! You MUST be diligent about lifestyle changes as well!    

## 2017-02-02 ENCOUNTER — Encounter: Payer: Self-pay | Admitting: Neurology

## 2017-02-02 ENCOUNTER — Encounter: Payer: Self-pay | Admitting: Osteopathic Medicine

## 2017-02-02 ENCOUNTER — Ambulatory Visit: Payer: 59 | Admitting: Physician Assistant

## 2017-02-02 LAB — POCT URINALYSIS DIPSTICK
BILIRUBIN UA: NEGATIVE
Blood, UA: NEGATIVE
KETONES UA: NEGATIVE
LEUKOCYTES UA: NEGATIVE
Nitrite, UA: NEGATIVE
Protein, UA: NEGATIVE
Spec Grav, UA: 1.025 (ref 1.010–1.025)
Urobilinogen, UA: 0.2 E.U./dL
pH, UA: 6 (ref 5.0–8.0)

## 2017-02-12 ENCOUNTER — Other Ambulatory Visit: Payer: Self-pay | Admitting: Osteopathic Medicine

## 2017-02-12 DIAGNOSIS — R102 Pelvic and perineal pain: Secondary | ICD-10-CM | POA: Diagnosis not present

## 2017-02-12 DIAGNOSIS — F419 Anxiety disorder, unspecified: Secondary | ICD-10-CM

## 2017-02-12 DIAGNOSIS — Z113 Encounter for screening for infections with a predominantly sexual mode of transmission: Secondary | ICD-10-CM | POA: Diagnosis not present

## 2017-02-13 DIAGNOSIS — Z113 Encounter for screening for infections with a predominantly sexual mode of transmission: Secondary | ICD-10-CM | POA: Diagnosis not present

## 2017-02-13 MED ORDER — ALPRAZOLAM 0.5 MG PO TABS: 0.5000 mg | ORAL_TABLET | Freq: Every day | ORAL | 0 refills | Status: DC | PRN

## 2017-02-15 ENCOUNTER — Other Ambulatory Visit: Payer: Self-pay | Admitting: Osteopathic Medicine

## 2017-02-15 DIAGNOSIS — F419 Anxiety disorder, unspecified: Secondary | ICD-10-CM

## 2017-02-16 MED ORDER — ALPRAZOLAM 0.5 MG PO TABS: 0.5000 mg | ORAL_TABLET | Freq: Every day | ORAL | 3 refills | Status: DC | PRN

## 2017-02-16 NOTE — Telephone Encounter (Signed)
Will forward request to provider for approval. 

## 2017-02-27 ENCOUNTER — Telehealth: Payer: Self-pay | Admitting: *Deleted

## 2017-02-27 NOTE — Telephone Encounter (Signed)
Pre Authorization sent to cover my meds.[id number]

## 2017-03-02 DIAGNOSIS — N925 Other specified irregular menstruation: Secondary | ICD-10-CM | POA: Diagnosis not present

## 2017-03-02 DIAGNOSIS — R87612 Low grade squamous intraepithelial lesion on cytologic smear of cervix (LGSIL): Secondary | ICD-10-CM | POA: Diagnosis not present

## 2017-03-02 DIAGNOSIS — B373 Candidiasis of vulva and vagina: Secondary | ICD-10-CM | POA: Diagnosis not present

## 2017-03-02 DIAGNOSIS — Z3202 Encounter for pregnancy test, result negative: Secondary | ICD-10-CM | POA: Diagnosis not present

## 2017-03-07 MED FILL — BELVIQ 10 MG TABLET: 10 | 30 days supply | Qty: 60 | Fill #0 | Status: TO

## 2017-03-09 NOTE — Telephone Encounter (Signed)
PA for belviq has been approved  For a maximum of 3 fills from 03/05/2017 to 06/02/2017. Patient aware and has already picked up the medication

## 2017-03-16 ENCOUNTER — Emergency Department
Admission: EM | Admit: 2017-03-16 | Discharge: 2017-03-16 | Disposition: A | Discharge status: Home / Self Care | Payer: 59 | Source: Home / Self Care | Attending: Family Medicine | Admitting: Family Medicine

## 2017-03-16 ENCOUNTER — Other Ambulatory Visit: Payer: Self-pay

## 2017-03-16 ENCOUNTER — Encounter: Payer: Self-pay | Admitting: Emergency Medicine

## 2017-03-16 DIAGNOSIS — N3000 Acute cystitis without hematuria: Secondary | ICD-10-CM | POA: Diagnosis not present

## 2017-03-16 LAB — POCT URINALYSIS DIP (MANUAL ENTRY)
Blood, UA: NEGATIVE
GLUCOSE UA: NEGATIVE mg/dL
LEUKOCYTES UA: NEGATIVE
Nitrite, UA: POSITIVE — AB
PROTEIN UA: NEGATIVE mg/dL
SPEC GRAV UA: 1.025 (ref 1.010–1.025)
UROBILINOGEN UA: 0.2 U/dL
pH, UA: 5.5 (ref 5.0–8.0)

## 2017-03-16 MED ORDER — FLUCONAZOLE 200 MG PO TABS: 200.0000 mg | ORAL_TABLET | Freq: Once | ORAL | 0 refills | Status: AC

## 2017-03-16 MED ORDER — CEPHALEXIN 500 MG PO CAPS: 500.0000 mg | ORAL_CAPSULE | Freq: Two times a day (BID) | ORAL | 0 refills | Status: DC

## 2017-03-16 MED FILL — FLUCONAZOLE 200 MG TABLET: 200 | 6 days supply | Qty: 2 | Fill #0

## 2017-03-16 MED FILL — CEPHALEXIN 500 MG CAPSULE: 500 | 7 days supply | Qty: 14 | Fill #0

## 2017-03-16 NOTE — ED Provider Notes (Signed)
[facility]    CSN: [id number] Arrival date & time: 03/16/17  1518     History   Chief Complaint Chief Complaint  Patient presents with  . Urinary Tract Infection    HPI Brittany Anderson is a 31 y.o. female.   HPI Brittany Anderson is a 31 y.o. female presenting to UC with c/o dark urine, cloudy, foul odor, and nausea for 6 days. Mild suprapubic discomfort. Last UTI was about 1 month ago. She was tx with amoxicillin. Symptoms had resolved and she had another UA, which was normal.  Prior to last UTI one month ago, the last UTI was when pt was in high school.  She does report having her diabetes medication changed within the last 2 months but states her CBG has been under control. No new sexual partners. No change in daily hygiene routine. No vaginal symptoms. Denies fever, or chills but has had nausea w/o vomiting. Denies concern for pregnancy. Pt states he checked 2 weeks at her OB/GYN appointment.    Past Medical History:  Diagnosis Date  . Diabetes mellitus without complication (HCC)   . Hypertension     Patient Active Problem List   Diagnosis Date Noted  . Anxiety disorder 11/05/2016  . Exercise-induced asthma 09/13/2016  . BMI 35.0-35.9,adult 09/13/2016  . Uses contraception 03/19/2015  . Type 2 diabetes mellitus without complication (HCC) 02/03/2014  . Primary insomnia 12/22/2013  . Class 2 obesity 07/27/2011  . Migraine with aura and without status migrainosus, not intractable 07/27/2011  . ADD (attention deficit disorder) 07/13/2011  . Essential hypertension 07/23/2009  . SHOULDER PAIN, RIGHT 07/23/2009  . NECK SPASM 07/23/2009    Past Surgical History:  Procedure Laterality Date  . APPENDECTOMY      OB History    No data available       Home Medications    Prior to Admission medications   Medication Sig Start Date End Date Taking? Authorizing Provider  albuterol (PROVENTIL HFA;VENTOLIN HFA) 108 (90 Base) MCG/ACT inhaler Inhale 2  puffs into the lungs every 6 (six) hours as needed for wheezing or shortness of breath (and prior to exercise). 09/12/16   Sunnie NielsenAlexander, Natalie, DO  ALPRAZolam Prudy Feeler(XANAX) 0.5 MG tablet Take 1 tablet (0.5 mg total) by mouth daily as needed for anxiety. #30 for thirty days 02/16/17   Sunnie NielsenAlexander, Natalie, DO  butalbital-acetaminophen-caffeine [place]quist(FIORICET, [facility]) [phone] MG tablet  08/01/16   [provider]  cephALEXin (KEFLEX) 500 MG capsule Take 1 capsule (500 mg total) by mouth 2 (two) times daily. 03/16/17   Lurene ShadowPhelps, Loella Hickle O, PA-C  fluconazole (DIFLUCAN) 200 MG tablet Take 1 tablet (200 mg total) by mouth once for 1 dose. May repeat in 3 days if still having symptoms. 03/16/17 03/16/17  Lurene ShadowPhelps, Seairra Otani O, PA-C  glimepiride (AMARYL) 2 MG tablet Take 1 tablet (2 mg total) daily before breakfast by mouth. 02/01/17   Sunnie NielsenAlexander, Natalie, DO  hydrochlorothiazide (HYDRODIURIL) 25 MG tablet Take 1 tablet (25 mg total) daily by mouth. 02/01/17   Sunnie NielsenAlexander, Natalie, DO  ibuprofen (ADVIL,MOTRIN) 800 MG tablet Take 1 tablet (800 mg total) every 6 (six) hours as needed by mouth. 02/01/17   Sunnie NielsenAlexander, Natalie, DO  Lorcaserin HCl (BELVIQ) 10 MG TABS Take 10 mg 2 (two) times daily by mouth. 02/01/17   Sunnie NielsenAlexander, Natalie, DO  metFORMIN (GLUCOPHAGE-XR) 500 MG 24 hr tablet Take 2 tablets (1,000 mg total) daily with breakfast by mouth. 02/01/17 02/01/18  Sunnie NielsenAlexander, Natalie, DO  ondansetron (ZOFRAN-ODT) 4 MG disintegrating tablet  Take 1 tablet (4 mg total) by mouth every 6 (six) hours as needed for nausea or vomiting. 11/02/16   Carlis Stable, PA-C  propranolol ER (INDERAL LA) 80 MG 24 hr capsule Take 1 capsule (80 mg total) daily by mouth. 02/01/17   Sunnie Nielsen, DO    Family History Family History  Problem Relation Age of Onset  . Depression Mother   . Diabetes Mother   . Hypertension Mother   . Diabetes Father   . Hypertension Father   . Hypertension Maternal Grandmother   . Alcohol abuse  Maternal Grandfather   . Depression Maternal Grandfather   . Stroke Maternal Grandfather     Social History Social History   Tobacco Use  . Smoking status: Former Smoker    Last attempt to quit: 2009    Years since quitting: 9.9  . Smokeless tobacco: Never Used  Substance Use Topics  . Alcohol use: Yes  . Drug use: No     Allergies   Cyclobenzaprine hcl; Hydrocodone-acetaminophen; and Hydrocodone-ibuprofen   Review of Systems Review of Systems  Gastrointestinal: Positive for abdominal pain (mild bladder discomfort) and nausea. Negative for diarrhea and vomiting.  Genitourinary: Positive for dysuria, frequency and urgency. Negative for decreased urine volume, hematuria, vaginal bleeding, vaginal discharge and vaginal pain.  Musculoskeletal: Negative for back pain and myalgias.     Physical Exam Triage Vital Signs ED Triage Vitals [03/16/17 1542]  Enc Vitals Group     BP 120/83     Pulse Rate 65     Resp      Temp 98.2 F (36.8 C)     Temp Source Oral     SpO2 100 %     Weight 240 lb (108.9 kg)     Height 5\' 7"  (1.702 m)     Head Circumference      Peak Flow      Pain Score 0     Pain Loc      Pain Edu?      Excl. in GC?    No data found.  Updated Vital Signs BP 120/83 (BP Location: Right Arm)   Pulse 65   Temp 98.2 F (36.8 C) (Oral)   Ht 5\' 7"  (1.702 m)   Wt 240 lb (108.9 kg)   SpO2 100%   BMI 37.59 kg/m   Visual Acuity Right Eye Distance:   Left Eye Distance:   Bilateral Distance:    Right Eye Near:   Left Eye Near:    Bilateral Near:     Physical Exam  Constitutional: She is oriented to person, place, and time. She appears well-developed and well-nourished. No distress.  HENT:  Head: Normocephalic and atraumatic.  Mouth/Throat: Oropharynx is clear and moist.  Eyes: EOM are normal.  Neck: Normal range of motion.  Cardiovascular: Normal rate and regular rhythm.  Pulmonary/Chest: Effort normal and breath sounds normal. No stridor. No  respiratory distress. She has no wheezes. She has no rales.  Abdominal: Soft. She exhibits no distension. There is no tenderness. There is no CVA tenderness.  Musculoskeletal: Normal range of motion.  Neurological: She is alert and oriented to person, place, and time.  Skin: Skin is warm and dry. She is not diaphoretic.  Psychiatric: She has a normal mood and affect. Her behavior is normal.  Nursing note and vitals reviewed.    UC Treatments / Results  Labs (all labs ordered are listed, but only abnormal results are displayed) Labs Reviewed  POCT URINALYSIS  DIP (MANUAL ENTRY) - Abnormal; Notable for the following components:      Result Value   Clarity, UA cloudy (*)    Bilirubin, UA small (*)    Ketones, POC UA trace (5) (*)    Nitrite, UA Positive (*)    All other components within normal limits  URINE CULTURE    EKG  EKG Interpretation None       Radiology No results found.  Procedures Procedures (including critical care time)  Medications Ordered in UC Medications - No data to display   Initial Impression / Assessment and Plan / UC Course  I have reviewed the triage vital signs and the nursing notes.  Pertinent labs & imaging results that were available during my care of the patient were reviewed by me and considered in my medical decision making (see chart for details).     UA c/w UTI. Culture sent Will start on Keflex Question of recent changes to diabetes medications could be cause of most recent back to back UTIs. Offered pregnancy test due to nausea, pt declined stating she was tested 2 weeks ago.  Encouraged fluids F/u with PCP or OB/GYN in 1 week if not improving, or if symptoms resolve but quickly return again.  Diflucan prescribed. Pt states she "always" gets yeast infections with antibiotics.   Final Clinical Impressions(s) / UC Diagnoses   Final diagnoses:  Acute cystitis without hematuria    ED Discharge Orders        Ordered     cephALEXin (KEFLEX) 500 MG capsule  2 times daily     03/16/17 1550    fluconazole (DIFLUCAN) 200 MG tablet   Once     03/16/17 1550       Controlled Substance Prescriptions [place] Controlled Substance Registry consulted? Not Applicable   Rolla Platehelps, Slyvia Lartigue O, PA-C 03/16/17 1651

## 2017-03-16 NOTE — ED Triage Notes (Signed)
Dark urine, cloudy, foul odor, nausea x 6 days

## 2017-03-19 ENCOUNTER — Telehealth: Payer: Self-pay

## 2017-03-19 LAB — URINE CULTURE
MICRO NUMBER: 81457593
SPECIMEN QUALITY:: ADEQUATE

## 2017-03-19 NOTE — Telephone Encounter (Signed)
Spoke with patient, feeling better, will follow up as needed.

## 2017-04-09 ENCOUNTER — Telehealth: Payer: Self-pay

## 2017-04-09 NOTE — Telephone Encounter (Signed)
Pt called and said that she was seen in late December with a UTI.  Pt is having same sx.  Left message for patient to call and needs to be seen for sx if continuing.

## 2017-04-10 ENCOUNTER — Emergency Department
Admission: EM | Admit: 2017-04-10 | Discharge: 2017-04-10 | Disposition: A | Discharge status: Home / Self Care | Payer: No Typology Code available for payment source | Source: Home / Self Care | Attending: Family Medicine | Admitting: Family Medicine

## 2017-04-10 ENCOUNTER — Other Ambulatory Visit: Payer: Self-pay

## 2017-04-10 DIAGNOSIS — R300 Dysuria: Secondary | ICD-10-CM

## 2017-04-10 DIAGNOSIS — Z202 Contact with and (suspected) exposure to infections with a predominantly sexual mode of transmission: Secondary | ICD-10-CM

## 2017-04-10 LAB — POCT URINALYSIS DIP (MANUAL ENTRY)
Bilirubin, UA: NEGATIVE
Blood, UA: NEGATIVE
Glucose, UA: NEGATIVE mg/dL
Leukocytes, UA: NEGATIVE
Nitrite, UA: NEGATIVE
Protein Ur, POC: NEGATIVE mg/dL
Spec Grav, UA: 1.015 (ref 1.010–1.025)
Urobilinogen, UA: 0.2 E.U./dL
pH, UA: 5.5 (ref 5.0–8.0)

## 2017-04-10 MED FILL — BELVIQ 10 MG TABLET: 10 | 30 days supply | Qty: 60 | Fill #0

## 2017-04-10 NOTE — ED Provider Notes (Signed)
[facility]    CSN: [id number] Arrival date & time: 04/10/17  1102     History   Chief Complaint Chief Complaint  Patient presents with  . urinary pressure    HPI Brittany Anderson is a 32 y.o. female.   HPI Brittany Anderson is a 32 y.o. female presenting to UC with c/o pressure in urethra that she was seen for similar urinary discomfort at UC 3-4 weeks ago.  She was tx with Keflex and symptoms started to improve but she states the discomfort gradually came back over the last 2-3 days.  She denies vaginal discharge or odor but does question if she may have an STI but no specific known exposure.  Denies fever, chills, n/v/d. Denies back or belly pain.   Past Medical History:  Diagnosis Date  . Diabetes mellitus without complication (HCC)   . Hypertension     Patient Active Problem List   Diagnosis Date Noted  . Anxiety disorder 11/05/2016  . Exercise-induced asthma 09/13/2016  . BMI 35.0-35.9,adult 09/13/2016  . Uses contraception 03/19/2015  . Type 2 diabetes mellitus without complication (HCC) 02/03/2014  . Primary insomnia 12/22/2013  . Class 2 obesity 07/27/2011  . Migraine with aura and without status migrainosus, not intractable 07/27/2011  . ADD (attention deficit disorder) 07/13/2011  . Essential hypertension 07/23/2009  . SHOULDER PAIN, RIGHT 07/23/2009  . NECK SPASM 07/23/2009    Past Surgical History:  Procedure Laterality Date  . APPENDECTOMY      OB History    No data available       Home Medications    Prior to Admission medications   Medication Sig Start Date End Date Taking? Authorizing Provider  albuterol (PROVENTIL HFA;VENTOLIN HFA) 108 (90 Base) MCG/ACT inhaler Inhale 2 puffs into the lungs every 6 (six) hours as needed for wheezing or shortness of breath (and prior to exercise). 09/12/16   Sunnie Nielsen, DO  ALPRAZolam Prudy Feeler) 0.5 MG tablet Take 1 tablet (0.5 mg total) by mouth daily as needed for anxiety. #30 for  thirty days 02/16/17   Sunnie Nielsen, DO  butalbital-acetaminophen-caffeine [facility]) [phone] MG tablet  08/01/16   [provider]  cephALEXin (KEFLEX) 500 MG capsule Take 1 capsule (500 mg total) by mouth 2 (two) times daily. 03/16/17   Lurene Shadow, PA-C  glimepiride (AMARYL) 2 MG tablet Take 1 tablet (2 mg total) daily before breakfast by mouth. 02/01/17   Sunnie Nielsen, DO  hydrochlorothiazide (HYDRODIURIL) 25 MG tablet Take 1 tablet (25 mg total) daily by mouth. 02/01/17   Sunnie Nielsen, DO  ibuprofen (ADVIL,MOTRIN) 800 MG tablet Take 1 tablet (800 mg total) every 6 (six) hours as needed by mouth. 02/01/17   Sunnie Nielsen, DO  Lorcaserin HCl (BELVIQ) 10 MG TABS Take 10 mg 2 (two) times daily by mouth. 02/01/17   Sunnie Nielsen, DO  metFORMIN (GLUCOPHAGE-XR) 500 MG 24 hr tablet Take 2 tablets (1,000 mg total) daily with breakfast by mouth. 02/01/17 02/01/18  Sunnie Nielsen, DO  ondansetron (ZOFRAN-ODT) 4 MG disintegrating tablet Take 1 tablet (4 mg total) by mouth every 6 (six) hours as needed for nausea or vomiting. 11/02/16   Carlis Stable, PA-C  propranolol ER (INDERAL LA) 80 MG 24 hr capsule Take 1 capsule (80 mg total) daily by mouth. 02/01/17   Sunnie Nielsen, DO    Family History Family History  Problem Relation Age of Onset  . Depression Mother   . Diabetes Mother   . Hypertension Mother   .  Diabetes Father   . Hypertension Father   . Hypertension Maternal Grandmother   . Alcohol abuse Maternal Grandfather   . Depression Maternal Grandfather   . Stroke Maternal Grandfather     Social History Social History   Tobacco Use  . Smoking status: Former Smoker    Last attempt to quit: 2009    Years since quitting: 10.0  . Smokeless tobacco: Never Used  Substance Use Topics  . Alcohol use: Yes  . Drug use: No     Allergies   Cyclobenzaprine hcl; Hydrocodone-acetaminophen; and  Hydrocodone-ibuprofen   Review of Systems Review of Systems  Constitutional: Negative for chills and fever.  Gastrointestinal: Negative for abdominal pain, diarrhea, nausea and vomiting.  Genitourinary: Positive for dysuria and pelvic pain (pressure). Negative for decreased urine volume, flank pain, frequency, genital sores, hematuria, urgency, vaginal bleeding, vaginal discharge and vaginal pain.  Musculoskeletal: Negative for back pain and myalgias.     Physical Exam Triage Vital Signs ED Triage Vitals [04/10/17 1131]  Enc Vitals Group     BP 115/77     Pulse Rate 82     Resp      Temp 98.3 F (36.8 C)     Temp Source Oral     SpO2 98 %     Weight 238 lb (108 kg)     Height 5\' 7"  (1.702 m)     Head Circumference      Peak Flow      Pain Score 0     Pain Loc      Pain Edu?      Excl. in GC?    No data found.  Updated Vital Signs BP 115/77 (BP Location: Right Arm)   Pulse 82   Temp 98.3 F (36.8 C) (Oral)   Ht 5\' 7"  (1.702 m)   Wt 238 lb (108 kg)   SpO2 98%   BMI 37.28 kg/m   Visual Acuity Right Eye Distance:   Left Eye Distance:   Bilateral Distance:    Right Eye Near:   Left Eye Near:    Bilateral Near:     Physical Exam  Constitutional: She is oriented to person, place, and time. She appears well-developed and well-nourished. No distress.  HENT:  Head: Normocephalic and atraumatic.  Mouth/Throat: Oropharynx is clear and moist.  Eyes: EOM are normal.  Neck: Normal range of motion.  Cardiovascular: Normal rate and regular rhythm.  Pulmonary/Chest: Effort normal and breath sounds normal. No stridor. No respiratory distress. She has no wheezes. She has no rales.  Abdominal: Soft. She exhibits no distension. There is no tenderness. There is no CVA tenderness.  Musculoskeletal: Normal range of motion.  Neurological: She is alert and oriented to person, place, and time.  Skin: Skin is warm and dry. She is not diaphoretic.  Psychiatric: She has a normal  mood and affect. Her behavior is normal.  Nursing note and vitals reviewed.    UC Treatments / Results  Labs (all labs ordered are listed, but only abnormal results are displayed) Labs Reviewed  POCT URINALYSIS DIP (MANUAL ENTRY) - Abnormal; Notable for the following components:      Result Value   Clarity, UA cloudy (*)    Ketones, POC UA small (15) (*)    All other components within normal limits  URINE CULTURE  C. TRACHOMATIS/N. GONORRHOEAE RNA  WET PREP BY MOLECULAR PROBE    EKG  EKG Interpretation None       Radiology No results  found.  Procedures Procedures (including critical care time)  Medications Ordered in UC Medications - No data to display   Initial Impression / Assessment and Plan / UC Course  I have reviewed the triage vital signs and the nursing notes.  Pertinent labs & imaging results that were available during my care of the patient were reviewed by me and considered in my medical decision making (see chart for details).     UA: unremarkable Culture sent  Pt provided self-swab for GC/chlamydia and wet prep, sent to lab Will hold off on treatment at this time Encouraged fluids. F/u with PCP or GYN in 1 week if not improving.   Final Clinical Impressions(s) / UC Diagnoses   Final diagnoses:  Dysuria  Possible exposure to STD    ED Discharge Orders    None       Controlled Substance Prescriptions [place] Controlled Substance Registry consulted? Not Applicable   Rolla Platehelps, Aimy Sweeting O, PA-C 04/10/17 1625

## 2017-04-10 NOTE — ED Triage Notes (Signed)
Pt was here 3-4 weeks ago with similar sx.  Took keflex, and was doing better, but gradually sx came back.  The only sx she is NOT having this time is the odor.

## 2017-04-11 LAB — C. TRACHOMATIS/N. GONORRHOEAE RNA
C. trachomatis RNA, TMA: NOT DETECTED
N. gonorrhoeae RNA, TMA: NOT DETECTED

## 2017-04-11 LAB — WET PREP BY MOLECULAR PROBE
Candida species: NOT DETECTED
MICRO NUMBER:: 90090329
SPECIMEN QUALITY:: ADEQUATE
Trichomonas vaginosis: NOT DETECTED

## 2017-04-12 ENCOUNTER — Telehealth: Payer: Self-pay | Admitting: *Deleted

## 2017-04-12 LAB — URINE CULTURE
MICRO NUMBER:: 90090327
SPECIMEN QUALITY:: ADEQUATE

## 2017-04-12 MED ORDER — AMOXICILLIN-POT CLAVULANATE 875-125 MG PO TABS: 1.0000 | ORAL_TABLET | Freq: Two times a day (BID) | ORAL | 0 refills | Status: AC

## 2017-04-12 MED FILL — FLUCONAZOLE 150 MG TABLET: 150 | 4 days supply | Qty: 2 | Fill #0

## 2017-04-12 MED FILL — NITROFURANTOIN MONO-MCR 100: 100 | 7 days supply | Qty: 14 | Fill #0

## 2017-04-12 MED FILL — AMOX TR-K CLV 875-125 MG TA: 875-125 | 7 days supply | Qty: 14 | Fill #0

## 2017-04-12 NOTE — Telephone Encounter (Signed)
UCX results given and discussed. Per Dr. Cathren HarshBeese will send in Augmentin 875mg  1 BID with meals #14 [facility].

## 2017-04-13 ENCOUNTER — Telehealth: Payer: Self-pay | Admitting: *Deleted

## 2017-04-13 MED ORDER — FLUCONAZOLE 200 MG PO TABS: ORAL_TABLET | ORAL | 1 refills | Status: DC

## 2017-04-13 MED FILL — FLUCONAZOLE 200 MG TAB: 200 | 1 days supply | Qty: 1 | Fill #0 | Status: TO

## 2017-04-13 NOTE — Telephone Encounter (Signed)
Patient reports she forgot to tell nurse yesterday that she needs 200mg  diflucan as opposed to 150mg  Diflucan. She is diabetic and her yeast infections do not respond to the 150mg . [facility]

## 2017-04-17 ENCOUNTER — Ambulatory Visit: Payer: Self-pay | Admitting: Osteopathic Medicine

## 2017-04-17 DIAGNOSIS — Z0189 Encounter for other specified special examinations: Secondary | ICD-10-CM

## 2017-04-19 ENCOUNTER — Encounter: Payer: Self-pay | Admitting: Osteopathic Medicine

## 2017-04-19 MED ORDER — BUTALBITAL-APAP-CAFFEINE 50-325-40 MG PO TABS: 1.0000 | ORAL_TABLET | Freq: Four times a day (QID) | ORAL | 0 refills | Status: DC | PRN

## 2017-04-19 MED FILL — BUTALB-ACETAMIN-CAFF 50-325: 50-325-40 | 4 days supply | Qty: 14 | Fill #0

## 2017-04-19 MED FILL — FLUCONAZOLE 200 MG TABLET: 200 | 1 days supply | Qty: 1 | Fill #0

## 2017-04-26 ENCOUNTER — Encounter: Payer: Self-pay | Admitting: Osteopathic Medicine

## 2017-04-26 ENCOUNTER — Ambulatory Visit (INDEPENDENT_AMBULATORY_CARE_PROVIDER_SITE_OTHER): Payer: No Typology Code available for payment source | Admitting: Osteopathic Medicine

## 2017-04-26 VITALS — BP 132/77 | HR 82 | Temp 97.7°F | Wt 241.1 lb

## 2017-04-26 DIAGNOSIS — R300 Dysuria: Secondary | ICD-10-CM | POA: Diagnosis not present

## 2017-04-26 DIAGNOSIS — E119 Type 2 diabetes mellitus without complications: Secondary | ICD-10-CM

## 2017-04-26 LAB — POCT URINALYSIS DIPSTICK
Bilirubin, UA: NEGATIVE
Blood, UA: NEGATIVE
Glucose, UA: 500
Ketones, UA: NEGATIVE
LEUKOCYTES UA: NEGATIVE
NITRITE UA: POSITIVE
PROTEIN UA: NEGATIVE
SPEC GRAV UA: 1.025 (ref 1.010–1.025)
Urobilinogen, UA: 0.2 E.U./dL
pH, UA: 5.5 (ref 5.0–8.0)

## 2017-04-26 LAB — POCT GLYCOSYLATED HEMOGLOBIN (HGB A1C): HEMOGLOBIN A1C: 8.3

## 2017-04-26 MED ORDER — SULFAMETHOXAZOLE-TRIMETHOPRIM 400-80 MG PO TABS: 0.5000 | ORAL_TABLET | Freq: Every day | ORAL | 1 refills | Status: DC

## 2017-04-26 MED ORDER — FLUCONAZOLE 200 MG PO TABS
ORAL_TABLET | ORAL | 1 refills | Status: DC
Start: 2017-04-26 — End: 2017-05-17

## 2017-04-26 MED ORDER — SULFAMETHOXAZOLE-TRIMETHOPRIM 800-160 MG PO TABS: 1.0000 | ORAL_TABLET | Freq: Two times a day (BID) | ORAL | 0 refills | Status: DC

## 2017-04-26 MED ORDER — METFORMIN HCL 1000 MG PO TABS: 1000.0000 mg | ORAL_TABLET | Freq: Two times a day (BID) | ORAL | 3 refills | Status: DC

## 2017-04-26 MED FILL — FLUCONAZOLE 200 MG TABLET: 200 | 1 days supply | Qty: 1 | Fill #0

## 2017-04-26 MED FILL — SULFAMETHOXAZOLE-TMP DS TAB: 800-160 | 7 days supply | Qty: 14 | Fill #0

## 2017-04-26 MED FILL — metFORMIN HCL 1000 MG TABS: 1000 | 90 days supply | Qty: 180 | Fill #0

## 2017-04-26 NOTE — Progress Notes (Signed)
HPI: Brittany Anderson is a 32 y.o. female who  has a past medical history of Diabetes mellitus without complication (HCC) and Hypertension.  she presents to [facility] today, 04/26/17,  for chief complaint of: Persistent UTI symptoms DM2 follow-up   Months of dysuria and ffrequency/ Recently treated by urgent care for E Coli Cx(+) UTI 04/10/17, neg STI testing. Sent in Augmentin. Same CX results 03/16/17 treated w/ Keflex. UA showed no Glc. She is felt marginally better after antibiotics but symptoms never really seemed to have resolved. She also sat treatment at urgent care out of town, results unavailable from around Thanksgiving, urinary tract infection reportedly present at that time as well.  A1C today 8.3% up from 7.8%. Patient states she has not been particularly compliant with lifestyle modifications/diet and exercise. No polydipsia    Past medical, surgical, social and family history reviewed:  Patient Active Problem List   Diagnosis Date Noted  . Anxiety disorder 11/05/2016  . Exercise-induced asthma 09/13/2016  . BMI 35.0-35.9,adult 09/13/2016  . Uses contraception 03/19/2015  . Type 2 diabetes mellitus without complication (HCC) 02/03/2014  . Primary insomnia 12/22/2013  . Class 2 obesity 07/27/2011  . Migraine with aura and without status migrainosus, not intractable 07/27/2011  . ADD (attention deficit disorder) 07/13/2011  . Essential hypertension 07/23/2009  . SHOULDER PAIN, RIGHT 07/23/2009  . NECK SPASM 07/23/2009       Current medication list and allergy/intolerance information reviewed  Allergies  Allergen Reactions  . Cyclobenzaprine Hcl     REACTION: itching  . Hydrocodone-Acetaminophen     REACTION: itching  . Hydrocodone-Ibuprofen Itching      Review of Systems:  Constitutional:  No  fever, no chills  HEENT: No  headache  Cardiac: No  chest pain, No  pressure, No palpitation  Respiratory:  No   shortness of breath. No  Cough  Gastrointestinal: No  abdominal pain, No  nausea, No  vomiting,  No  blood in stool, No  diarrhea, No  constipation   Musculoskeletal: No new myalgia/arthralgia  Skin: No  Rash  Genitourinary: No  incontinence, No  abnormal genital bleeding, No abnormal genital discharge  Neurologic: No  weakness, No  dizziness   Exam:  BP 132/77   Pulse 82   Temp 97.7 F (36.5 C) (Oral)   Wt 241 lb 1.3 oz (109.4 kg)   LMP 03/27/2017   BMI 37.76 kg/m   Constitutional: VS see above. General Appearance: alert, well-developed, well-nourished, NAD  Eyes: Normal lids and conjunctive, non-icteric sclera  Ears, Nose, Mouth, Throat: MMM, Normal external inspection ears/nares/mouth/lips/gums.   Neck: No masses, trachea midline.   Respiratory: Normal respiratory effort. no wheeze, no rhonchi, no rales  Cardiovascular: S1/S2 normal, no murmur, no rub/gallop auscultated. RRR.  Gastrointestinal: Nontender, no masses.   Musculoskeletal: Gait normal. No clubbing/cyanosis of digits. Neg [place] bilaterally  Neurological: Normal balance/coordination. No tremor.  Skin: warm, dry, intact.  Psychiatric: Normal judgment/insight. Normal mood and affect. Oriented x3.    Results for orders placed or performed in visit on 04/26/17 (from the past 72 hour(s))  POCT Urinalysis Dipstick     Status: None   Collection Time: 04/26/17  8:27 AM  Result Value Ref Range   Color, UA ORANGE    Clarity, UA CLEAR    Glucose, UA 500 MG/DL    Bilirubin, UA NEGATIVE    Ketones, UA NEGATIVE    Spec Grav, UA 1.025 1.010 - 1.025   Blood, UA NEGATIVE  pH, UA 5.5 5.0 - 8.0   Protein, UA NEGATIVE    Urobilinogen, UA 0.2 0.2 or 1.0 E.U./dL   Nitrite, UA POSITIVE    Leukocytes, UA Negative Negative   Appearance     Odor    POCT HgB A1C     Status: None   Collection Time: 04/26/17  8:27 AM  Result Value Ref Range   Hemoglobin A1C 8.3       ASSESSMENT/PLAN:   Dysuria - Plan: POCT  Urinalysis Dipstick, Urine Culture, Urinalysis, microscopic only  Type 2 diabetes mellitus without complication, without long-term current use of insulin (HCC) - Plan: POCT HgB A1C    Meds ordered this encounter  Medications  . sulfamethoxazole-trimethoprim (BACTRIM DS) 800-160 MG tablet    Sig: Take 1 tablet by mouth 2 (two) times daily.    Dispense:  14 tablet    Refill:  0  . sulfamethoxazole-trimethoprim (BACTRIM) 400-80 MG tablet    Sig: Take 0.5 tablets by mouth at bedtime.    Dispense:  90 tablet    Refill:  1  . metFORMIN (GLUCOPHAGE) 1000 MG tablet    Sig: Take 1 tablet (1,000 mg total) by mouth 2 (two) times daily with a meal.    Dispense:  180 tablet    Refill:  3  . fluconazole (DIFLUCAN) 200 MG tablet    Sig: Take one by mouth as a single dose. May refill in 3 days if needed.    Dispense:  1 tablet    Refill:  1     Patient Instructions  Plan: Increase Metformin Diet/exercise!  Treat UTI now with higher does Bactrim After that, suppressive low-dose bactrim x6 months      Visit summary with medication list and pertinent instructions was printed for patient to review. All questions at time of visit were answered - patient instructed to contact office with any additional concerns. ER/RTC precautions were reviewed with the patient.   Follow-up plan: Return in about 3 months (around 07/24/2017) for recheck  A1C, sooner if needed.  Note: Total time spent 25 minutes, greater than 50% of the visit was spent face-to-face counseling and coordinating care for the following: The primary encounter diagnosis was Dysuria. A diagnosis of Type 2 diabetes mellitus without complication, without long-term current use of insulin (HCC) was also pertinent to this visit.Marland Kitchen.  Please note: voice recognition software was used to produce this document, and typos may escape review. Please contact Dr. Lyn HollingsheadAlexander for any needed clarifications.

## 2017-04-26 NOTE — Patient Instructions (Signed)
Plan: Increase Metformin Diet/exercise!  Treat UTI now with higher does Bactrim After that, suppressive low-dose bactrim x6 months

## 2017-04-27 LAB — URINALYSIS, MICROSCOPIC ONLY
HYALINE CAST: NONE SEEN /LPF
WBC, UA: NONE SEEN /HPF (ref 0–5)

## 2017-04-27 LAB — URINE CULTURE
MICRO NUMBER: 90167340
Result:: NO GROWTH
SPECIMEN QUALITY: ADEQUATE

## 2017-04-30 ENCOUNTER — Ambulatory Visit: Payer: 59 | Admitting: Osteopathic Medicine

## 2017-05-02 ENCOUNTER — Encounter: Payer: Self-pay | Admitting: Osteopathic Medicine

## 2017-05-02 LAB — HM DIABETES EYE EXAM

## 2017-05-07 MED FILL — HYDROCHLOROTHIAZIDE 25 MG T: 25 | 90 days supply | Qty: 90 | Fill #0

## 2017-05-07 MED FILL — BELVIQ 10 MG TABLET: 10 | 30 days supply | Qty: 60 | Fill #1

## 2017-05-07 MED FILL — IBUPROFEN 800 MG TAB: 800 | 23 days supply | Qty: 90 | Fill #1 | Status: TO

## 2017-05-07 MED FILL — PROPRANOLOL ER 80 MG CAP: 80 | 90 days supply | Qty: 90 | Fill #0

## 2017-05-07 MED FILL — GLIMEPIRIDE 2 MG TABLET: 2 | 90 days supply | Qty: 90 | Fill #1

## 2017-05-10 ENCOUNTER — Encounter: Payer: Self-pay | Admitting: Osteopathic Medicine

## 2017-05-10 DIAGNOSIS — N390 Urinary tract infection, site not specified: Secondary | ICD-10-CM

## 2017-05-17 MED ORDER — FLUCONAZOLE 200 MG PO TABS: 200.0000 mg | ORAL_TABLET | Freq: Once | ORAL | 1 refills | Status: AC

## 2017-05-17 NOTE — Addendum Note (Signed)
Addended by: Deirdre PippinsALEXANDER, Mayleigh Tetrault M on: 05/17/2017 09:54 AM   Modules accepted: Orders

## 2017-05-21 ENCOUNTER — Ambulatory Visit: Payer: No Typology Code available for payment source | Admitting: Neurology

## 2017-05-21 ENCOUNTER — Encounter: Payer: Self-pay | Admitting: Neurology

## 2017-05-21 VITALS — BP 114/80 | HR 80 | Ht 67.0 in | Wt 238.5 lb

## 2017-05-21 DIAGNOSIS — G43009 Migraine without aura, not intractable, without status migrainosus: Secondary | ICD-10-CM | POA: Diagnosis not present

## 2017-05-21 MED ORDER — SUMATRIPTAN SUCCINATE 100 MG PO TABS: ORAL_TABLET | ORAL | 2 refills | Status: DC

## 2017-05-21 MED ORDER — PROPRANOLOL HCL ER 120 MG PO CP24: 120.0000 mg | ORAL_CAPSULE | Freq: Every day | ORAL | 2 refills | Status: DC

## 2017-05-21 MED FILL — PROPRANOLOL ER 120 MG CAP: 120 | 30 days supply | Qty: 30 | Fill #0

## 2017-05-21 MED FILL — SUMATRIPTAN SUCC 100 MG TAB: 100 | 30 days supply | Qty: 8 | Fill #0

## 2017-05-21 NOTE — Patient Instructions (Addendum)
Migraine Recommendations: 1.  Increase propranolol ER back to 120mg  daily 2.  Take sumatriptan 100mg  at earliest onset of headache.  May repeat dose once in 2 hours if needed.  Do not exceed two tablets in 24 hours.  If it causes drowsiness, then next time try cutting in half (50mg ). Zofran 4mg  for nausea. 3.  Limit use of pain relievers to no more than 2 days out of the week.  These medications include acetaminophen, ibuprofen, triptans and narcotics.  This will help reduce risk of rebound headaches. 4.  Be aware of common food triggers such as processed sweets, processed foods with nitrites (such as deli meat, hot dogs, sausages), foods with MSG, alcohol (such as wine), chocolate, certain cheeses, certain fruits (dried fruits, bananas, some citrus fruit), vinegar, diet soda. 4.  Avoid caffeine 5.  Routine exercise 6.  Proper sleep hygiene 7.  Stay adequately hydrated with water 8.  Keep a headache diary. 9.  Maintain proper stress management. 10.  Do not skip meals. 11.  Consider supplements:  Magnesium citrate 400mg  to 600mg  daily, riboflavin 400mg , Coenzyme Q 10 100mg  three times daily 12.  Follow up in 3 months.

## 2017-05-21 NOTE — Progress Notes (Signed)
NEUROLOGY CONSULTATION NOTE  Brittany Anderson MRN: [id number] DOB: 1985/11/09  Referring provider: Dr. Lyn Hollingshead Primary care provider: Dr. Lyn Hollingshead  Reason for consult:  migraine  HISTORY OF PRESENT ILLNESS: Brittany Anderson is a 32 year old female with type 2 diabetes mellitus and hypertension who presents for migraines  Onset:  31 years old Location:  Varies:  Bilateral frontal/retro-orbital or occipital and back of neck Quality:  throbbing Intensity:  Moderate to severe.  She denies new headache, thunderclap headache or severe headache that wakes from sleep. Aura:  no Prodrome:  lightheadedness Postdrome:  no Associated symptoms:  Photophobia, phonophobia, osmophobia, sometimes blurred vision or slurred speech.  Sometimes nausea.  No vomiting..  She denies associated unilateral numbness or weakness. Duration:  Usually 18 hours Frequency:  Infrequent up until November when propranolol ER was decreased from 120mg  to 80mg .  She wanted to see if propranolol was contributing to weight gain.  She has not lost weight. Frequency of abortive medication: most days a week Triggers/exacerbating factors:  Menses, certain smells, hunger Relieving factors:  Rest/sleep Activity:  aggravates  Current NSAIDS:  ibuprofen 800mg  (takes daily for chronic left hip pain) Current analgesics:  Fioricet, Excedrin Current triptans:  no Current anti-emetic:  Zofran 4mg  Current muscle relaxants:  no Current anti-anxiolytic:  no Current sleep aide:  no Current Antihypertensive medications:  propranolol ER 80mg , HCTZ Current Antidepressant medications:  no Current Anticonvulsant medications:  no Current Vitamins/Herbal/Supplements:  MVI, D, magnesium 400mg  Current Antihistamines/Decongestants:  no Other therapy:  Essential oils with mint, hot shower  Past NSAIDS:  naproxen Past analgesics:  Tylenol Past abortive triptans:  Maxalt (caused nausea) Past muscle relaxants:  Flexeril (effective  but caused drowsiness) Past anti-emetic:  Promethazine (drowsiness) Past antihypertensive medications:  no Past antidepressant medications:  no Past anticonvulsant medications:  no Past vitamins/Herbal/Supplements:  no Past antihistamines/decongestants:  no Other past therapies:  no  Caffeine:  kuber tea Alcohol:  rarely Smoker:  no Hydrates:  yes Exercise:  Needs to improve Depression:  no; Anxiety:  mild Other pain:  Chronic left hip pain Sleep hygiene:  varies Family history of headache:  Father, sister  PAST MEDICAL HISTORY: Past Medical History:  Diagnosis Date  . Diabetes mellitus without complication (HCC)   . Hypertension     PAST SURGICAL HISTORY: Past Surgical History:  Procedure Laterality Date  . APPENDECTOMY      MEDICATIONS: Current Outpatient Medications on File Prior to Visit  Medication Sig Dispense Refill  . albuterol (PROVENTIL HFA;VENTOLIN HFA) 108 (90 Base) MCG/ACT inhaler Inhale 2 puffs into the lungs every 6 (six) hours as needed for wheezing or shortness of breath (and prior to exercise). 2 Inhaler 11  . ALPRAZolam (XANAX) 0.5 MG tablet Take 1 tablet (0.5 mg total) by mouth daily as needed for anxiety. #30 for thirty days 30 tablet 3  . butalbital-acetaminophen-caffeine (FIORICET, ESGIC) 50-325-40 MG tablet Take 1 tablet by mouth every 6 (six) hours as needed for headache or migraine. 14 tablet 0  . glimepiride (AMARYL) 2 MG tablet Take 1 tablet (2 mg total) daily before breakfast by mouth. 90 tablet 1  . hydrochlorothiazide (HYDRODIURIL) 25 MG tablet Take 1 tablet (25 mg total) daily by mouth. 90 tablet 3  . ibuprofen (ADVIL,MOTRIN) 800 MG tablet Take 1 tablet (800 mg total) every 6 (six) hours as needed by mouth. 90 tablet 1  . Lorcaserin HCl (BELVIQ) 10 MG TABS Take 10 mg 2 (two) times daily by mouth. 180 tablet 1  . metFORMIN (  GLUCOPHAGE) 1000 MG tablet Take 1 tablet (1,000 mg total) by mouth 2 (two) times daily with a meal. 180 tablet 3  .  sulfamethoxazole-trimethoprim (BACTRIM DS) 800-160 MG tablet Take 1 tablet by mouth 2 (two) times daily. 14 tablet 0  . sulfamethoxazole-trimethoprim (BACTRIM) 400-80 MG tablet Take 0.5 tablets by mouth at bedtime. 90 tablet 1   No current facility-administered medications on file prior to visit.     ALLERGIES: Allergies  Allergen Reactions  . Cyclobenzaprine Itching    REACTION: itching  . Hydrocodone-Acetaminophen Itching    REACTION: itching  . Hydrocodone-Ibuprofen Itching  . Cyclobenzaprine Hcl     REACTION: itching  . Hydrocodone-Acetaminophen     REACTION: itching    FAMILY HISTORY: Family History  Problem Relation Age of Onset  . Depression Mother   . Diabetes Mother   . Hypertension Mother   . Diabetes Father   . Hypertension Father   . Hypertension Maternal Grandmother   . Alcohol abuse Maternal Grandfather   . Depression Maternal Grandfather   . Stroke Maternal Grandfather     SOCIAL HISTORY: Social History   Socioeconomic History  . Marital status: Single    Spouse name: Not on file  . Number of children: Not on file  . Years of education: Not on file  . Highest education level: Not on file  Social Needs  . Financial resource strain: Not on file  . Food insecurity - worry: Not on file  . Food insecurity - inability: Not on file  . Transportation needs - medical: Not on file  . Transportation needs - non-medical: Not on file  Occupational History  . Not on file  Tobacco Use  . Smoking status: Former Smoker    Last attempt to quit: 2009    Years since quitting: 10.1  . Smokeless tobacco: Never Used  Substance and Sexual Activity  . Alcohol use: Yes  . Drug use: No  . Sexual activity: Not Currently  Other Topics Concern  . Not on file  Social History Narrative  . Not on file    REVIEW OF SYSTEMS: Constitutional: No fevers, chills, or sweats, no generalized fatigue, change in appetite Eyes: No visual changes, double vision, eye pain Ear,  nose and throat: No hearing loss, ear pain, nasal congestion, sore throat Cardiovascular: No chest pain, palpitations Respiratory:  No shortness of breath at rest or with exertion, wheezes GastrointestinaI: No nausea, vomiting, diarrhea, abdominal pain, fecal incontinence Genitourinary:  No dysuria, urinary retention or frequency Musculoskeletal:  No neck pain, back pain Integumentary: No rash, pruritus, skin lesions Neurological: as above Psychiatric: No depression, insomnia, anxiety Endocrine: No palpitations, fatigue, diaphoresis, mood swings, change in appetite, change in weight, increased thirst Hematologic/Lymphatic:  No purpura, petechiae. Allergic/Immunologic: no itchy/runny eyes, nasal congestion, recent allergic reactions, rashes  PHYSICAL EXAM: Vitals:   05/21/17 0810  BP: 114/80  Pulse: 80  SpO2: 97%   General: No acute distress.  Patient appears well-groomed.  Head:  Normocephalic/atraumatic Eyes:  fundi examined but not visualized Neck: supple, right paraspinal tenderness, full range of motion Back: No paraspinal tenderness Heart: regular rate and rhythm Lungs: Clear to auscultation bilaterally. Vascular: No carotid bruits. Neurological Exam: Mental status: alert and oriented to person, place, and time, recent and remote memory intact, fund of knowledge intact, attention and concentration intact, speech fluent and not dysarthric, language intact. Cranial nerves: CN I: not tested CN II: pupils equal, round and reactive to light, visual fields intact CN III, IV, VI:  full range of motion, no nystagmus, no ptosis CN V: facial sensation intact CN VII: upper and lower face symmetric CN VIII: hearing intact CN IX, X: gag intact, uvula midline CN XI: sternocleidomastoid and trapezius muscles intact CN XII: tongue midline Bulk & Tone: normal, no fasciculations. Motor:  5/5 throughout  Sensation: temperature and vibration sensation intact. Deep Tendon Reflexes:  2+  throughout, toes downgoing.  Finger to nose testing:  Without dysmetria.  Heel to shin:  Without dysmetria.  Gait:  Normal station and stride.  Able to turn and tandem walk. Romberg negative.  IMPRESSION: Migraine without aura, not intractable  PLAN: 1.  As she has not seen any weight loss on lower dose of propranolol, we will increase dose back to 120mg  daily (as it was effective) 2.  She will try sumatriptan 100mg  for abortive therapy.  If causes too much drowsiness, she will try 50mg .  If effective but causes drowsiness, she can try a different triptan or she can keep the sumatriptan for home and try toradol NS for work. 3.  Stop Fioricet and Excedrin.  Limit use of pain relievers to no more than 2 days out of the week to help reduce risk of rebound headaches. 4.  Be aware of common food triggers such as processed sweets, processed foods with nitrites (such as deli meat, hot dogs, sausages), foods with MSG, alcohol (such as wine), chocolate, certain cheeses, certain fruits (dried fruits, bananas, some citrus fruit), vinegar, diet soda. 4.  Avoid caffeine 5.  Routine exercise 6.  Proper sleep hygiene 7.  Stay adequately hydrated with water 8.  Keep a headache diary. 9.  Maintain proper stress management. 10.  Do not skip meals. 11.  Consider supplements:  Magnesium citrate 400mg  to 600mg  daily, riboflavin 400mg , Coenzyme Q 10 100mg  three times daily 12.  Follow up in 3 months.  Thank you for allowing me to take part in the care of this patient.  45 minutes spent face to face with patient, over 50% spent discussing management.  Shon Millet, DO  CC:  Sunnie Nielsen, DO

## 2017-05-23 MED FILL — FLUCONAZOLE 200 MG TABLET: 200 | 6 days supply | Qty: 3 | Fill #0

## 2017-06-18 ENCOUNTER — Telehealth: Payer: Self-pay | Admitting: Osteopathic Medicine

## 2017-06-18 NOTE — Telephone Encounter (Signed)
Received fax from [employer] that Belviq requires a PA. Information has been sent to the insurance company. Awaiting determination.   

## 2017-06-21 MED FILL — BELVIQ 10 MG TABLET: 10 | 30 days supply | Qty: 60 | Fill #2

## 2017-06-21 NOTE — Telephone Encounter (Signed)
Received fax from [employer] that Belviq was approved from 06/20/2017 until 06/20/2018. Pharmacy notified and forms sent to scan.   Reference ID: [id number].

## 2017-07-13 ENCOUNTER — Encounter: Payer: Self-pay | Admitting: Neurology

## 2017-07-16 ENCOUNTER — Telehealth: Payer: Self-pay

## 2017-07-16 NOTE — Telephone Encounter (Signed)
Steward Drone from Dr. Malena Peer office left a vm msg requesting provider to return call. No other details provided in vm msg. Steward Drone will be available until 1 pm today or tomorrow from 9 am to 1 pm. Call back information is [phone].

## 2017-07-17 ENCOUNTER — Telehealth: Payer: Self-pay

## 2017-07-17 NOTE — Telephone Encounter (Signed)
Noted. I will attempt to call after morning clinic today

## 2017-07-17 NOTE — Telephone Encounter (Signed)
Called - phone rang and rang no voicemail If they call back again, apologies I wasn't able to reach her and please ask what specifically they need or what questions they may have

## 2017-07-17 NOTE — Telephone Encounter (Signed)
Steward Drone from Dr. Malena Peer called again. Requesting provider to return call back regarding pt. She can reached at [phone] & is available between 9 am - 1 pm today.

## 2017-07-18 NOTE — Telephone Encounter (Signed)
Attempted to contact office to get more information, no answer and no voicemail.

## 2017-07-24 ENCOUNTER — Ambulatory Visit: Payer: No Typology Code available for payment source | Admitting: Osteopathic Medicine

## 2017-07-25 NOTE — Telephone Encounter (Signed)
As per Steward Drone - pt was prescribe phentermine 37.5 mg since December by Dr. Okey Dupre. They rec'd a call from pharmacy stating that pt is also taking Belviq med. Pt did not update office of this information. Dr. Okey Dupre is ok with overlap of medications since both meds work differently, however Dr. Okey Dupre wants Dr. Lyn Hollingshead to be aware of add'l weight loss medication. Steward Drone stated pharmacy records shows that pt has been taking both phentermine/belviq monthly since December. Dr. Okey Dupre can be reached at [phone] for further inquiries.

## 2017-07-27 MED FILL — HYDROCHLOROTHIAZIDE 25 MG T: 25 | 90 days supply | Qty: 90 | Fill #1

## 2017-07-27 MED FILL — BELVIQ 10 MG TABLET: 10 | 30 days supply | Qty: 60 | Fill #3

## 2017-07-30 NOTE — Telephone Encounter (Signed)
Routing to PCP

## 2017-07-30 NOTE — Telephone Encounter (Signed)
Appreciate them giving [place] an FYI.  In my opinion, there is really no reason she should be on phentermine that long.  I would not continue this prescription myself.  We will get detailed medication reconciliation next time she is in the office here with [place].

## 2017-07-31 NOTE — Telephone Encounter (Signed)
Noted  

## 2017-08-02 ENCOUNTER — Ambulatory Visit: Payer: No Typology Code available for payment source | Admitting: Osteopathic Medicine

## 2017-08-03 ENCOUNTER — Other Ambulatory Visit: Payer: Self-pay | Admitting: Osteopathic Medicine

## 2017-08-03 ENCOUNTER — Telehealth: Payer: Self-pay

## 2017-08-03 DIAGNOSIS — F419 Anxiety disorder, unspecified: Secondary | ICD-10-CM

## 2017-08-03 NOTE — Telephone Encounter (Signed)
Ok..the patient notified of needing office visit for more refills. W.Alisah Grandberry, CCMA

## 2017-08-03 NOTE — Telephone Encounter (Signed)
Patient has recently canceled several follow-up visits and no showed her appointment yesterday. We'll send this one refill but she needs an office visit prior to any additional authorizations of refills.

## 2017-08-03 NOTE — Telephone Encounter (Signed)
Controlled substance 

## 2017-08-03 NOTE — Telephone Encounter (Signed)
[facility] requesting RF on pt's [facility].  Last written 02-16-18 for #30 with 3 RF Last OV 04-26-17 Next OV sched for 08-09-17  RX pended, please review and send if appropriate   Thanks!

## 2017-08-09 ENCOUNTER — Ambulatory Visit (INDEPENDENT_AMBULATORY_CARE_PROVIDER_SITE_OTHER): Payer: No Typology Code available for payment source | Admitting: Osteopathic Medicine

## 2017-08-09 ENCOUNTER — Encounter: Payer: Self-pay | Admitting: Osteopathic Medicine

## 2017-08-09 VITALS — BP 118/72 | HR 76 | Temp 98.0°F | Wt 219.5 lb

## 2017-08-09 DIAGNOSIS — R5382 Chronic fatigue, unspecified: Secondary | ICD-10-CM

## 2017-08-09 DIAGNOSIS — N390 Urinary tract infection, site not specified: Secondary | ICD-10-CM

## 2017-08-09 DIAGNOSIS — E119 Type 2 diabetes mellitus without complications: Secondary | ICD-10-CM | POA: Diagnosis not present

## 2017-08-09 DIAGNOSIS — Z8349 Family history of other endocrine, nutritional and metabolic diseases: Secondary | ICD-10-CM | POA: Diagnosis not present

## 2017-08-09 LAB — POCT GLYCOSYLATED HEMOGLOBIN (HGB A1C): Hemoglobin A1C: 6.3 % — AB (ref 4.0–5.6)

## 2017-08-09 MED ORDER — LORCASERIN HCL 10 MG PO TABS: 10.0000 mg | ORAL_TABLET | Freq: Two times a day (BID) | ORAL | 1 refills | Status: DC

## 2017-08-09 NOTE — Addendum Note (Signed)
Addended by: Deirdre Pippins on: 08/09/2017 03:34 PM   Modules accepted: Orders

## 2017-08-09 NOTE — Progress Notes (Addendum)
HPI: Brittany Anderson is a 32 y.o. female who  has a past medical history of Diabetes mellitus without complication (HCC) and Hypertension.  she presents to [facility] today, 08/09/17,  for chief complaint of:  DM2  A1C looking a lot better! She is doing well with weight loss and diet. She notes some dropping Glc but no LOC or severe dizziness, would like to get off the glimepiride if I'm ok with this.   Reports fatigue issues, family history thyroid problems, she's like basic labs done if possible - thinks fatigue is mostly d/t night shift but just wants to be sure.   Working 7P-7A shift, has no-showed 2 appts, we discussed this re: clinic policy   Hx recurrent UTI, doing well on low dose bactrim, no additional UTI problems    Results for orders placed or performed in visit on 08/09/17 (from the past 24 hour(s))  POCT HgB A1C     Status: Abnormal   Collection Time: 08/09/17  2:20 PM  Result Value Ref Range   Hemoglobin A1C 6.3 (A) 4.0 - 5.6 %   HbA1c, POC (prediabetic range)  5.7 - 6.4 %   HbA1c, POC (controlled diabetic range)  0.0 - 7.0 %     Past medical history, surgical history, and family history reviewed.  Current medication list and allergy/intolerance information reviewed.   (See remainder of HPI, ROS, Phys Exam below)    Results for orders placed or performed in visit on 08/09/17 (from the past 72 hour(s))  POCT HgB A1C     Status: Abnormal   Collection Time: 08/09/17  2:20 PM  Result Value Ref Range   Hemoglobin A1C 6.3 (A) 4.0 - 5.6 %   HbA1c, POC (prediabetic range)  5.7 - 6.4 %   HbA1c, POC (controlled diabetic range)  0.0 - 7.0 %     ASSESSMENT/PLAN: The primary encounter diagnosis was Type 2 diabetes mellitus without complication, without long-term current use of insulin (HCC). Diagnoses of Chronic fatigue, Family history of thyroid disease, and Recurrent UTI were also pertinent to this visit.    Meds ordered this  encounter  Medications  . Lorcaserin HCl (BELVIQ) 10 MG TABS    Sig: Take 10 mg by mouth 2 (two) times daily.    Dispense:  180 tablet    Refill:  1   D/C Glimepiride and will check A1C 3 mos    Follow-up plan: Return in about 3 months (around 11/09/2017) for A1C RECHECK, sooner if needed .     ############################################ ############################################ ############################################ ############################################    Outpatient Encounter Medications as of 08/09/2017  Medication Sig  . albuterol (PROVENTIL HFA;VENTOLIN HFA) 108 (90 Base) MCG/ACT inhaler Inhale 2 puffs into the lungs every 6 (six) hours as needed for wheezing or shortness of breath (and prior to exercise).  . ALPRAZolam (XANAX) 0.5 MG tablet TAKE 1 TABLET (0.5 MG TOTAL) BY MOUTH DAILY AS NEEDED FOR ANXIETY. #30 FOR THIRTY DAYS  . butalbital-acetaminophen-caffeine (FIORICET, ESGIC) 50-325-40 MG tablet Take 1 tablet by mouth every 6 (six) hours as needed for headache or migraine.  Marland Kitchen glimepiride (AMARYL) 2 MG tablet Take 1 tablet (2 mg total) daily before breakfast by mouth.  . hydrochlorothiazide (HYDRODIURIL) 25 MG tablet Take 1 tablet (25 mg total) daily by mouth.  Marland Kitchen ibuprofen (ADVIL,MOTRIN) 800 MG tablet Take 1 tablet (800 mg total) every 6 (six) hours as needed by mouth.  . Lorcaserin HCl (BELVIQ) 10 MG TABS Take 10 mg 2 (  two) times daily by mouth.  . metFORMIN (GLUCOPHAGE) 1000 MG tablet Take 1 tablet (1,000 mg total) by mouth 2 (two) times daily with a meal.  . propranolol ER (INDERAL LA) 120 MG 24 hr capsule Take 1 capsule (120 mg total) by mouth daily.  Marland Kitchen sulfamethoxazole-trimethoprim (BACTRIM DS) 800-160 MG tablet Take 1 tablet by mouth 2 (two) times daily.  Marland Kitchen sulfamethoxazole-trimethoprim (BACTRIM) 400-80 MG tablet Take 0.5 tablets by mouth at bedtime.  . SUMAtriptan (IMITREX) 100 MG tablet Take 1 tablet earliest onset of migraine.  May repeat once in 2  hours if headache persists or recurs.  . phentermine (ADIPEX-P) 37.5 MG tablet Take 37.5 mg by mouth daily.   No facility-administered encounter medications on file as of 08/09/2017.    Allergies  Allergen Reactions  . Cyclobenzaprine Itching    REACTION: itching  . Hydrocodone-Acetaminophen Itching    REACTION: itching  . Hydrocodone-Ibuprofen Itching  . Cyclobenzaprine Hcl     REACTION: itching  . Hydrocodone-Acetaminophen     REACTION: itching      Review of Systems:  Constitutional: No recent illness, +fatigue as per HPI   HEENT: No  headache, no vision change  Cardiac: No  chest pain, No  pressure, No palpitations   Respiratory:  No  shortness of breath. No  Cough  Gastrointestinal: No  abdominal pain, no change on bowel habits  Musculoskeletal: No new myalgia/arthralgia  Skin: No  Rash  Neurologic: No  weakness, No  Dizziness   Exam:  BP 118/72 (BP Location: Left Arm, Patient Position: Sitting, Cuff Size: Large)   Pulse 76   Temp 98 F (36.7 C) (Oral)   Wt 219 lb 8 oz (99.6 kg)   BMI 34.38 kg/m   Constitutional: VS see above. General Appearance: alert, well-developed, well-nourished, NAD  Eyes: Normal lids and conjunctive, non-icteric sclera  Ears, Nose, Mouth, Throat: MMM, Normal external inspection ears/nares/mouth/lips/gums.  Neck: No masses, trachea midline.   Respiratory: Normal respiratory effort. no wheeze, no rhonchi, no rales  Cardiovascular: S1/S2 normal, no murmur, no rub/gallop auscultated. RRR.   Musculoskeletal: Gait normal. Symmetric and independent movement of all extremities  Neurological: Normal balance/coordination. No tremor.  Skin: warm, dry, intact.   Psychiatric: Normal judgment/insight. Normal mood and affect. Oriented x3.   Visit summary with medication list and pertinent instructions was printed for patient to review, advised to alert [place] if any changes needed. All questions at time of visit were answered - patient  instructed to contact office with any additional concerns. ER/RTC precautions were reviewed with the patient and understanding verbalized.   Follow-up plan: Return in about 3 months (around 11/09/2017) for A1C RECHECK, sooner if needed .  Note: Total time spent 25 minutes, greater than 50% of the visit was spent face-to-face counseling and coordinating care for the following: The primary encounter diagnosis was Type 2 diabetes mellitus without complication, without long-term current use of insulin (HCC). Diagnoses of Chronic fatigue and Family history of thyroid disease were also pertinent to this visit.Marland Kitchen  Please note: voice recognition software was used to produce this document, and typos may escape review. Please contact Dr. Lyn Hollingshead for any needed clarifications.

## 2017-08-27 ENCOUNTER — Ambulatory Visit: Payer: No Typology Code available for payment source | Admitting: Neurology

## 2017-08-28 MED FILL — metFORMIN HCL 1000 MG TABS: 1000 | 90 days supply | Qty: 180 | Fill #1

## 2017-08-28 MED FILL — BELVIQ 10 MG TABLET: 10 | 30 days supply | Qty: 60 | Fill #0

## 2017-09-04 ENCOUNTER — Other Ambulatory Visit: Payer: Self-pay | Admitting: Osteopathic Medicine

## 2017-09-05 ENCOUNTER — Encounter: Payer: Self-pay | Admitting: Osteopathic Medicine

## 2017-09-05 LAB — CBC/DIFF AMBIGUOUS DEFAULT
Basophils Absolute: 0 10*3/uL (ref 0.0–0.2)
Basos: 0 %
EOS (ABSOLUTE): 0.2 10*3/uL (ref 0.0–0.4)
Eos: 2 %
Hematocrit: 37.9 % (ref 34.0–46.6)
Hemoglobin: 12.8 g/dL (ref 11.1–15.9)
Immature Grans (Abs): 0 10*3/uL (ref 0.0–0.1)
Immature Granulocytes: 0 %
Lymphocytes Absolute: 3 10*3/uL (ref 0.7–3.1)
Lymphs: 33 %
MCH: 28.1 pg (ref 26.6–33.0)
MCHC: 33.8 g/dL (ref 31.5–35.7)
MCV: 83 fL (ref 79–97)
Monocytes Absolute: 0.5 10*3/uL (ref 0.1–0.9)
Monocytes: 6 %
Neutrophils Absolute: 5.2 10*3/uL (ref 1.4–7.0)
Neutrophils: 59 %
Platelets: 360 10*3/uL (ref 150–450)
RBC: 4.56 x10E6/uL (ref 3.77–5.28)
RDW: 13.5 % (ref 12.3–15.4)
WBC: 8.9 10*3/uL (ref 3.4–10.8)

## 2017-09-05 LAB — LIPID PANEL W/O CHOL/HDL RATIO
CHOLESTEROL TOTAL: 194 mg/dL (ref 100–199)
HDL: 29 mg/dL — AB (ref >39)
LDL CALC: 120 mg/dL — AB (ref 0–99)
TRIGLYCERIDES: 223 mg/dL — AB (ref 0–149)
VLDL Cholesterol Cal: 45 mg/dL — ABNORMAL HIGH (ref 5–40)

## 2017-09-05 LAB — COMPREHENSIVE METABOLIC PANEL
ALT: 10 IU/L (ref 0–32)
AST: 16 IU/L (ref 0–40)
Albumin/Globulin Ratio: 2.1 (ref 1.2–2.2)
Albumin: 4.7 g/dL (ref 3.5–5.5)
Alkaline Phosphatase: 64 IU/L (ref 39–117)
BUN/Creatinine Ratio: 16 (ref 9–23)
BUN: 12 mg/dL (ref 6–20)
Bilirubin Total: 0.3 mg/dL (ref 0.0–1.2)
CO2: 22 mmol/L (ref 20–29)
Calcium: 10 mg/dL (ref 8.7–10.2)
Chloride: 99 mmol/L (ref 96–106)
Creatinine, Ser: 0.76 mg/dL (ref 0.57–1.00)
GFR calc Af Amer: 118 mL/min/{1.73_m2} (ref >59)
GFR calc non Af Amer: 103 mL/min/{1.73_m2} (ref >59)
Globulin, Total: 2.2 g/dL (ref 1.5–4.5)
Glucose: 106 mg/dL — ABNORMAL HIGH (ref 65–99)
Potassium: 3.9 mmol/L (ref 3.5–5.2)
Sodium: 136 mmol/L (ref 134–144)
Total Protein: 6.9 g/dL (ref 6.0–8.5)

## 2017-09-05 LAB — VITAMIN D 25 HYDROXY (VIT D DEFICIENCY, FRACTURES): VIT D 25 HYDROXY: 41.5 ng/mL (ref 30.0–100.0)

## 2017-09-05 LAB — SPECIMEN STATUS REPORT

## 2017-09-14 ENCOUNTER — Other Ambulatory Visit: Payer: Self-pay | Admitting: Osteopathic Medicine

## 2017-09-14 NOTE — Telephone Encounter (Signed)
[name] requesting med RF ibuprofen 800 mg. Thanks.

## 2017-09-17 MED FILL — IBUPROFEN 800 MG TAB: 800 | 23 days supply | Qty: 90 | Fill #0

## 2017-09-17 NOTE — Telephone Encounter (Signed)
Pt has been updated.  

## 2017-09-18 MED FILL — FLUCONAZOLE 150 MG TABS: 150 | 3 days supply | Qty: 3 | Fill #0

## 2017-09-18 MED FILL — CHLORHEXIDINE 0.12% RINSE: 0.12 | 16 days supply | Qty: 473 | Fill #0

## 2017-09-18 MED FILL — AMOX-CLAV 875-125 MG TABLET: 875-125 | 7 days supply | Qty: 14 | Fill #0

## 2017-09-24 MED FILL — PROPRANOLOL ER 80 MG CAP: 80 | 90 days supply | Qty: 90 | Fill #1

## 2017-09-28 MED FILL — SULFAMETHOXAZOLE-TMP SS TAB: 400-80 | 90 days supply | Qty: 45 | Fill #0

## 2017-09-28 MED FILL — HYDROCHLOROTHIAZIDE 25 MG T: 25 | 90 days supply | Qty: 90 | Fill #2

## 2017-10-07 ENCOUNTER — Other Ambulatory Visit: Payer: Self-pay | Admitting: Osteopathic Medicine

## 2017-10-07 DIAGNOSIS — F419 Anxiety disorder, unspecified: Secondary | ICD-10-CM

## 2017-10-08 MED FILL — BELVIQ 10 MG TABLET: 10 | 30 days supply | Qty: 60 | Fill #1

## 2017-10-08 NOTE — Telephone Encounter (Signed)
[facility] requesting med RF for alprazolam.

## 2017-10-08 NOTE — Telephone Encounter (Signed)
Left a detailed vm msg for pt regarding med RF. Call back information provided.  

## 2017-10-09 MED FILL — FLUCONAZOLE 200 MG TABLET: 200 | 6 days supply | Qty: 3 | Fill #1

## 2017-10-24 ENCOUNTER — Ambulatory Visit (INDEPENDENT_AMBULATORY_CARE_PROVIDER_SITE_OTHER): Payer: No Typology Code available for payment source | Admitting: Osteopathic Medicine

## 2017-10-24 ENCOUNTER — Encounter: Payer: Self-pay | Admitting: Osteopathic Medicine

## 2017-10-24 ENCOUNTER — Other Ambulatory Visit (HOSPITAL_COMMUNITY)
Admission: RE | Admit: 2017-10-24 | Discharge: 2017-10-24 | Disposition: A | Discharge status: Home / Self Care | Payer: No Typology Code available for payment source | Source: Ambulatory Visit | Attending: Osteopathic Medicine | Admitting: Osteopathic Medicine

## 2017-10-24 DIAGNOSIS — B379 Candidiasis, unspecified: Secondary | ICD-10-CM

## 2017-10-24 DIAGNOSIS — Z113 Encounter for screening for infections with a predominantly sexual mode of transmission: Secondary | ICD-10-CM | POA: Insufficient documentation

## 2017-10-24 DIAGNOSIS — R300 Dysuria: Secondary | ICD-10-CM | POA: Insufficient documentation

## 2017-10-24 DIAGNOSIS — Z124 Encounter for screening for malignant neoplasm of cervix: Secondary | ICD-10-CM | POA: Insufficient documentation

## 2017-10-24 DIAGNOSIS — I10 Essential (primary) hypertension: Secondary | ICD-10-CM | POA: Insufficient documentation

## 2017-10-24 DIAGNOSIS — E119 Type 2 diabetes mellitus without complications: Secondary | ICD-10-CM | POA: Insufficient documentation

## 2017-10-24 LAB — WET PREP FOR TRICH, YEAST, CLUE
MICRO NUMBER:: 90934440
Specimen Quality: ADEQUATE

## 2017-10-24 LAB — POCT URINALYSIS DIPSTICK
BILIRUBIN UA: NEGATIVE
GLUCOSE UA: NEGATIVE
KETONES UA: NEGATIVE
Leukocytes, UA: NEGATIVE
Nitrite, UA: NEGATIVE
Protein, UA: NEGATIVE
RBC UA: NEGATIVE
UROBILINOGEN UA: 0.2 U/dL
pH, UA: 6.5 (ref 5.0–8.0)

## 2017-10-24 MED ORDER — FLUCONAZOLE 150 MG PO TABS: 150.0000 mg | ORAL_TABLET | Freq: Every day | ORAL | 0 refills | Status: DC

## 2017-10-24 MED ORDER — HYDROCHLOROTHIAZIDE 25 MG PO TABS: 25.0000 mg | ORAL_TABLET | Freq: Every day | ORAL | 3 refills | Status: DC

## 2017-10-24 MED ORDER — TERCONAZOLE 0.8 % VA CREA: 1.0000 | TOPICAL_CREAM | Freq: Every day | VAGINAL | 0 refills | Status: DC

## 2017-10-24 MED ORDER — LORCASERIN HCL 10 MG PO TABS: 10.0000 mg | ORAL_TABLET | Freq: Two times a day (BID) | ORAL | 1 refills | Status: DC

## 2017-10-24 MED ORDER — FLUCONAZOLE 150 MG PO TABS: 150.0000 mg | ORAL_TABLET | Freq: Once | ORAL | 1 refills | Status: DC

## 2017-10-24 MED FILL — TERCONAZOLE 0.8% VAGINAL CR: 0.8 | 3 days supply | Qty: 20 | Fill #0

## 2017-10-24 MED FILL — FLUCONAZOLE 150 MG TABS: 150 | 5 days supply | Qty: 5 | Fill #0

## 2017-10-24 NOTE — Progress Notes (Signed)
HPI: Brittany Anderson is a 32 y.o. female who  has a past medical history of Diabetes mellitus without complication (HCC) and Hypertension.  she [facility]  today, 10/24/17,  for chief complaint of: Will be losing her insurance, wants to catch up on a few medical issues.  See headings below  Pap smear: Patient requests Pap today, as well as a CD testing.  Also has been on antibiotics from her dentist recently and concerned about daily dosage as well as vaginal treatment.  Diabetes: Not quite yet due for repeat hemoglobin A1c but can get this on blood work.  Urinary: Concern for possible UTI, would like urine checked just to be on the safe side.  Hypertension: No chest pain, pressure, shortness of breath.  Would like hydrochlorothiazide refilled.     Past medical, surgical, social and family history reviewed:  Patient Active Problem List   Diagnosis Date Noted  . Anxiety disorder 11/05/2016  . Exercise-induced asthma 09/13/2016  . BMI 35.0-35.9,adult 09/13/2016  . Uses contraception 03/19/2015  . Type 2 diabetes mellitus without complication (HCC) 02/03/2014  . Primary insomnia 12/22/2013  . Class 2 obesity 07/27/2011  . Migraine with aura and without status migrainosus, not intractable 07/27/2011  . ADD (attention deficit disorder) 07/13/2011  . Essential hypertension 07/23/2009  . SHOULDER PAIN, RIGHT 07/23/2009  . NECK SPASM 07/23/2009    Past Surgical History:  Procedure Laterality Date  . APPENDECTOMY      Social History   Tobacco Use  . Smoking status: Former Smoker    Last attempt to quit: 2009    Years since quitting: 10.6  . Smokeless tobacco: Never Used  Substance Use Topics  . Alcohol use: Yes    Family History  Problem Relation Age of Onset  . Depression Mother   . Diabetes Mother   . Hypertension Mother   . Diabetes Father   . Hypertension Father   . Hypertension Maternal Grandmother   . Alcohol  abuse Maternal Grandfather   . Depression Maternal Grandfather   . Stroke Maternal Grandfather      Current medication list and allergy/intolerance information reviewed:    Current Outpatient Medications  Medication Sig Dispense Refill  . albuterol (PROVENTIL HFA;VENTOLIN HFA) 108 (90 Base) MCG/ACT inhaler Inhale 2 puffs into the lungs every 6 (six) hours as needed for wheezing or shortness of breath (and prior to exercise). 2 Inhaler 11  . ALPRAZolam (XANAX) 0.5 MG tablet TAKE 1 TABLET (0.5 MG TOTAL) BY MOUTH DAILY AS NEEDED FOR ANXIETY. #30 FOR THIRTY DAYS 30 tablet 1  . hydrochlorothiazide (HYDRODIURIL) 25 MG tablet Take 1 tablet (25 mg total) daily by mouth. 90 tablet 3  . ibuprofen (ADVIL,MOTRIN) 800 MG tablet TAKE 1 TABLET BY MOUTH EVERY 6 HOURS AS NEEDED 90 tablet 1  . Lorcaserin HCl (BELVIQ) 10 MG TABS Take 10 mg by mouth 2 (two) times daily. 180 tablet 1  . metFORMIN (GLUCOPHAGE) 1000 MG tablet Take 1 tablet (1,000 mg total) by mouth 2 (two) times daily with a meal. 180 tablet 3  . propranolol ER (INDERAL LA) 120 MG 24 hr capsule Take 1 capsule (120 mg total) by mouth daily. 30 capsule 2  . butalbital-acetaminophen-caffeine (FIORICET, ESGIC) 50-325-40 MG tablet Take 1 tablet by mouth every 6 (six) hours as needed for headache or migraine. (Patient not taking: Reported on 10/24/2017) 14 tablet 0  . phentermine (ADIPEX-P) 37.5 MG tablet Take 37.5 mg by mouth daily.  0  .  sulfamethoxazole-trimethoprim (BACTRIM) 400-80 MG tablet Take 0.5 tablets by mouth at bedtime. (Patient not taking: Reported on 10/24/2017) 90 tablet 1  . SUMAtriptan (IMITREX) 100 MG tablet Take 1 tablet earliest onset of migraine.  May repeat once in 2 hours if headache persists or recurs. (Patient not taking: Reported on 10/24/2017) 10 tablet 2   No current facility-administered medications for this visit.     Allergies  Allergen Reactions  . Cyclobenzaprine Itching    REACTION: itching  . Hydrocodone-Acetaminophen  Itching    REACTION: itching  . Hydrocodone-Ibuprofen Itching  . Cyclobenzaprine Hcl     REACTION: itching  . Hydrocodone-Acetaminophen     REACTION: itching      Review of Systems:  Constitutional:  No  fever, no chill  HEENT: No  headache, no vision change  Cardiac: No  chest pain, No  pressure, No palpitations  Respiratory:  No  shortness of breath. No  Cough  Gastrointestinal: No  abdominal pain, No  nausea  Musculoskeletal: No new myalgia/arthralgia  Skin: No  Rash  Genitourinary: No  incontinence, No  abnormal genital bleeding, +abnormal genital discharge  Hem/Onc: No  easy bruising/bleeding  Endocrine: No cold intolerance,  No heat intolerance. No polyuria/polydipsia/polyphagia    Neurologic: No  weakness, No  dizziness  Psychiatric: No  concerns with depression, No  concerns with anxiety  Exam:  BP 125/73 (BP Location: Left Arm, Patient Position: Sitting, Cuff Size: Large)   Pulse 72   Temp 98 F (36.7 C) (Oral)   Wt 208 lb 14.4 oz (94.8 kg)   BMI 32.72 kg/m   Constitutional: VS see above. General Appearance: alert, well-developed, well-nourished, NAD  Eyes: Normal lids and conjunctive, non-icteric sclera  Ears, Nose, Mouth, Throat: MMM, Normal external inspection ears/nares/mouth/lips/gums.   Neck: No masses, trachea midline. No thyroid enlargement. No tenderness/mass appreciated. No lymphadenopathy  Respiratory: Normal respiratory effort. no wheeze, no rhonchi, no rales  Cardiovascular: S1/S2 normal, no murmur, no rub/gallop auscultated. RRR. No lower extremity edema.   Gastrointestinal: Nontender, no masses.   Musculoskeletal: Gait normal. No clubbing/cyanosis of digits.   Neurological: Normal balance/coordination. No tremor.   Skin: warm, dry, intact. No rash/ulcer.  Psychiatric: Normal judgment/insight. Normal mood and affect. Oriented x3.  GYN: No lesions/ulcers to external genitalia, normal urethra, normal vaginal mucosa, thick whitish  discharge, cervix normal without lesions, uterus not enlarged or tender, adnexa no masses and nontender  BREAST: No rashes/skin changes, normal fibrous breast tissue, no masses or tenderness, normal nipple without discharge, normal axilla    Results for orders placed or performed in visit on 10/24/17 (from the past 72 hour(s))  POCT Urinalysis Dipstick     Status: Abnormal   Collection Time: 10/24/17 12:22 PM  Result Value Ref Range   Color, UA Yellow    Clarity, UA clear    Glucose, UA Negative Negative   Bilirubin, UA Negati    Ketones, UA Negative    Spec Grav, UA >=1.030 (A) 1.010 - 1.025   Blood, UA Negative    pH, UA 6.5 5.0 - 8.0   Protein, UA Negative Negative   Urobilinogen, UA 0.2 0.2 or 1.0 E.U./dL   Nitrite, UA Negative    Leukocytes, UA Negative Negative   Appearance     Odor        ASSESSMENT/PLAN: Diagnoses of Screening for STDs (sexually transmitted diseases), Dysuria, Yeast infection, Cervical cancer screening, Hypertension goal BP (blood pressure) < 130/80, and Type 2 diabetes mellitus without complication, without long-term current  use of insulin (HCC) were pertinent to this visit.   Orders Placed This Encounter  Procedures  . C. trachomatis/N. gonorrhoeae RNA  . WET PREP FOR TRICH, YEAST, CLUE  . Urine Culture  . Urinalysis, microscopic only  . CBC  . COMPLETE METABOLIC PANEL WITH GFR  . Lipid panel  . TSH  . Hemoglobin A1c  . POCT Urinalysis Dipstick   Meds ordered this encounter  Medications  . DISCONTD: fluconazole (DIFLUCAN) 150 MG tablet    Sig: Take 1 tablet (150 mg total) by mouth once for 1 dose.    Dispense:  2 tablet    Refill:  1  . fluconazole (DIFLUCAN) 150 MG tablet    Sig: Take 1 tablet (150 mg total) by mouth daily.    Dispense:  5 tablet    Refill:  0    Cancel 150 once dose thanks  . hydrochlorothiazide (HYDRODIURIL) 25 MG tablet    Sig: Take 1 tablet (25 mg total) by mouth daily.    Dispense:  90 tablet    Refill:  3  .  Lorcaserin HCl (BELVIQ) 10 MG TABS    Sig: Take 10 mg by mouth 2 (two) times daily.    Dispense:  180 tablet    Refill:  1  . terconazole (TERAZOL 3) 0.8 % vaginal cream    Sig: Place 1 applicator vaginally at bedtime.    Dispense:  20 g    Refill:  0      Visit summary with medication list and pertinent instructions was printed for patient to review. All questions at time of visit were answered - patient instructed to contact office with any additional concerns. ER/RTC precautions were reviewed with the patient.   Follow-up plan: Return for recheck as needed - definitely come see me when you get insurance again! .    Please note: voice recognition software was used to produce this document, and typos may escape review. Please contact Dr. Lyn Hollingshead for any needed clarifications.

## 2017-10-25 LAB — COMPLETE METABOLIC PANEL WITH GFR
AG RATIO: 2.1 (calc) (ref 1.0–2.5)
ALKALINE PHOSPHATASE (APISO): 65 U/L (ref 33–115)
ALT: 10 U/L (ref 6–29)
AST: 14 U/L (ref 10–30)
Albumin: 4.4 g/dL (ref 3.6–5.1)
BILIRUBIN TOTAL: 0.4 mg/dL (ref 0.2–1.2)
BUN: 17 mg/dL (ref 7–25)
CALCIUM: 9.4 mg/dL (ref 8.6–10.2)
CHLORIDE: 103 mmol/L (ref 98–110)
CO2: 25 mmol/L (ref 20–32)
Creat: 0.64 mg/dL (ref 0.50–1.10)
GFR, EST NON AFRICAN AMERICAN: 116 mL/min/{1.73_m2} (ref >=60)
GFR, Est African American: 135 mL/min/{1.73_m2} (ref >=60)
Globulin: 2.1 g/dL (calc) (ref 1.9–3.7)
Glucose, Bld: 133 mg/dL — ABNORMAL HIGH (ref 65–99)
POTASSIUM: 3.9 mmol/L (ref 3.5–5.3)
SODIUM: 138 mmol/L (ref 135–146)
Total Protein: 6.5 g/dL (ref 6.1–8.1)

## 2017-10-25 LAB — CBC
HCT: 35.8 % (ref 35.0–45.0)
Hemoglobin: 11.8 g/dL (ref 11.7–15.5)
MCH: 27.4 pg (ref 27.0–33.0)
MCHC: 33 g/dL (ref 32.0–36.0)
MCV: 83.3 fL (ref 80.0–100.0)
MPV: 10.3 fL (ref 7.5–12.5)
PLATELETS: 311 10*3/uL (ref 140–400)
RBC: 4.3 10*6/uL (ref 3.80–5.10)
RDW: 13 % (ref 11.0–15.0)
WBC: 8 10*3/uL (ref 3.8–10.8)

## 2017-10-25 LAB — LIPID PANEL
CHOLESTEROL: 193 mg/dL (ref <200)
HDL: 33 mg/dL — AB (ref >50)
LDL Cholesterol (Calc): 138 mg/dL (calc) — ABNORMAL HIGH
Non-HDL Cholesterol (Calc): 160 mg/dL (calc) — ABNORMAL HIGH (ref <130)
Total CHOL/HDL Ratio: 5.8 (calc) — ABNORMAL HIGH (ref <5.0)
Triglycerides: 104 mg/dL (ref <150)

## 2017-10-25 LAB — URINE CULTURE
MICRO NUMBER: 90936120
SPECIMEN QUALITY:: ADEQUATE

## 2017-10-25 LAB — URINALYSIS, MICROSCOPIC ONLY
Bacteria, UA: NONE SEEN /HPF
Hyaline Cast: NONE SEEN /LPF
RBC / HPF: NONE SEEN /HPF (ref 0–2)
WBC UA: NONE SEEN /HPF (ref 0–5)

## 2017-10-25 LAB — C. TRACHOMATIS/N. GONORRHOEAE RNA
C. trachomatis RNA, TMA: NOT DETECTED
N. GONORRHOEAE RNA, TMA: NOT DETECTED

## 2017-10-25 LAB — TSH: TSH: 1.39 mIU/L

## 2017-10-25 LAB — HEMOGLOBIN A1C
EAG (MMOL/L): 7 (calc)
Hgb A1c MFr Bld: 6 % of total Hgb — ABNORMAL HIGH (ref <5.7)
MEAN PLASMA GLUCOSE: 126 (calc)

## 2017-10-30 LAB — CYTOLOGY - PAP
Diagnosis: NEGATIVE
HPV 16/18/45 GENOTYPING: NEGATIVE
HPV: DETECTED — AB

## 2017-11-01 ENCOUNTER — Encounter: Payer: Self-pay | Admitting: Osteopathic Medicine

## 2017-11-06 ENCOUNTER — Emergency Department
Admission: EM | Admit: 2017-11-06 | Discharge: 2017-11-06 | Disposition: A | Discharge status: Home / Self Care | Payer: No Typology Code available for payment source | Source: Home / Self Care | Attending: Family Medicine | Admitting: Family Medicine

## 2017-11-06 ENCOUNTER — Encounter: Payer: Self-pay | Admitting: Emergency Medicine

## 2017-11-06 DIAGNOSIS — N760 Acute vaginitis: Secondary | ICD-10-CM

## 2017-11-06 LAB — POCT URINALYSIS DIP (MANUAL ENTRY)
Bilirubin, UA: NEGATIVE
Blood, UA: NEGATIVE
Glucose, UA: NEGATIVE mg/dL
Leukocytes, UA: NEGATIVE
Nitrite, UA: NEGATIVE
Protein Ur, POC: NEGATIVE mg/dL
Spec Grav, UA: 1.02 (ref 1.010–1.025)
Urobilinogen, UA: 0.2 E.U./dL
pH, UA: 5.5 (ref 5.0–8.0)

## 2017-11-06 LAB — POCT URINE PREGNANCY: Preg Test, Ur: NEGATIVE

## 2017-11-06 MED ORDER — FLUCONAZOLE 200 MG PO TABS: 200.0000 mg | ORAL_TABLET | Freq: Every day | ORAL | 0 refills | Status: DC

## 2017-11-06 NOTE — ED Provider Notes (Signed)
[facility]    CSN: [id number] Arrival date & time: 11/06/17  1302     History   Chief Complaint Chief Complaint  Patient presents with  . Vaginal Itching    HPI Brittany Anderson is a 32 y.o. female.   HPI Brittany Anderson is a 32 y.o. female presenting to UC with c/o vaginal itching and burning that has been going on for about 5 days.  Last month she was on 3 rounds of antibiotics for a tooth infection. She developed a severe vaginal yeast infection and was prescribed 5 days of diflucan 150mg  tabs and terconazole cream. Her symptoms resolved for about 1 week but have since returned.  She does have a hx of BV and wonders if that could be the cause. Denies fever, chills, n/v/d. Low concern for STIs.   Past Medical History:  Diagnosis Date  . Diabetes mellitus without complication (HCC)   . Hypertension     Patient Active Problem List   Diagnosis Date Noted  . Anxiety disorder 11/05/2016  . Exercise-induced asthma 09/13/2016  . BMI 35.0-35.9,adult 09/13/2016  . Uses contraception 03/19/2015  . Type 2 diabetes mellitus without complication (HCC) 02/03/2014  . Primary insomnia 12/22/2013  . Class 2 obesity 07/27/2011  . Migraine with aura and without status migrainosus, not intractable 07/27/2011  . ADD (attention deficit disorder) 07/13/2011  . Essential hypertension 07/23/2009  . SHOULDER PAIN, RIGHT 07/23/2009  . NECK SPASM 07/23/2009    Past Surgical History:  Procedure Laterality Date  . APPENDECTOMY      OB History   None      Home Medications    Prior to Admission medications   Medication Sig Start Date End Date Taking? Authorizing Provider  albuterol (PROVENTIL HFA;VENTOLIN HFA) 108 (90 Base) MCG/ACT inhaler Inhale 2 puffs into the lungs every 6 (six) hours as needed for wheezing or shortness of breath (and prior to exercise). 09/12/16   Sunnie Nielsen, DO  ALPRAZolam Prudy Feeler) 0.5 MG tablet TAKE 1 TABLET (0.5 MG TOTAL) BY MOUTH  DAILY AS NEEDED FOR ANXIETY. #30 FOR THIRTY DAYS 10/08/17   Monica Becton, MD  butalbital-acetaminophen-caffeine (FIORICET, ESGIC) [phone] MG tablet Take 1 tablet by mouth every 6 (six) hours as needed for headache or migraine. Patient not taking: Reported on 10/24/2017 04/19/17   Sunnie Nielsen, DO  fluconazole (DIFLUCAN) 200 MG tablet Take 1 tablet (200 mg total) by mouth daily. 11/06/17   Lurene Shadow, PA-C  hydrochlorothiazide (HYDRODIURIL) 25 MG tablet Take 1 tablet (25 mg total) by mouth daily. 10/24/17   Sunnie Nielsen, DO  ibuprofen (ADVIL,MOTRIN) 800 MG tablet TAKE 1 TABLET BY MOUTH EVERY 6 HOURS AS NEEDED 09/17/17   Sunnie Nielsen, DO  Lorcaserin HCl (BELVIQ) 10 MG TABS Take 10 mg by mouth 2 (two) times daily. 10/24/17   Sunnie Nielsen, DO  metFORMIN (GLUCOPHAGE) 1000 MG tablet Take 1 tablet (1,000 mg total) by mouth 2 (two) times daily with a meal. 04/26/17   Sunnie Nielsen, DO  phentermine (ADIPEX-P) 37.5 MG tablet Take 37.5 mg by mouth daily. 07/14/17   [provider]  propranolol ER (INDERAL LA) 120 MG 24 hr capsule Take 1 capsule (120 mg total) by mouth daily. 05/21/17   Drema Dallas, DO  SUMAtriptan (IMITREX) 100 MG tablet Take 1 tablet earliest onset of migraine.  May repeat once in 2 hours if headache persists or recurs. Patient not taking: Reported on 10/24/2017 05/21/17   Drema Dallas, DO  terconazole (TERAZOL 3)  0.8 % vaginal cream Place 1 applicator vaginally at bedtime. 10/24/17   Sunnie NielsenAlexander, Natalie, DO    Family History Family History  Problem Relation Age of Onset  . Depression Mother   . Diabetes Mother   . Hypertension Mother   . Diabetes Father   . Hypertension Father   . Hypertension Maternal Grandmother   . Alcohol abuse Maternal Grandfather   . Depression Maternal Grandfather   . Stroke Maternal Grandfather     Social History Social History   Tobacco Use  . Smoking status: Former Smoker    Last attempt to quit: 2009    Years  since quitting: 10.6  . Smokeless tobacco: Never Used  Substance Use Topics  . Alcohol use: Yes  . Drug use: No     Allergies   Cyclobenzaprine; Hydrocodone-acetaminophen; Hydrocodone-ibuprofen; Cyclobenzaprine hcl; and Hydrocodone-acetaminophen   Review of Systems Review of Systems  Constitutional: Negative for chills and fever.  Gastrointestinal: Negative for abdominal pain, diarrhea, nausea and vomiting.  Genitourinary: Positive for vaginal discharge and vaginal pain ( itching and burning). Negative for dysuria and frequency.     Physical Exam Triage Vital Signs ED Triage Vitals  Enc Vitals Group     BP      Pulse      Resp      Temp      Temp src      SpO2      Weight      Height      Head Circumference      Peak Flow      Pain Score      Pain Loc      Pain Edu?      Excl. in GC?    No data found.  Updated Vital Signs BP 132/86 (BP Location: Right Arm)   Pulse 84   Temp 98.1 F (36.7 C) (Oral)   Wt 200 lb (90.7 kg)   LMP 10/16/2017   SpO2 98%   BMI 31.32 kg/m   Visual Acuity Right Eye Distance:   Left Eye Distance:   Bilateral Distance:    Right Eye Near:   Left Eye Near:    Bilateral Near:     Physical Exam  Constitutional: She is oriented to person, place, and time. She appears well-developed and well-nourished.  HENT:  Head: Normocephalic and atraumatic.  Mouth/Throat: Oropharynx is clear and moist.  Eyes: EOM are normal.  Neck: Normal range of motion.  Cardiovascular: Normal rate and regular rhythm.  Pulmonary/Chest: Effort normal. No respiratory distress.  Genitourinary: There is erythema in the vagina. Vaginal discharge found.  Genitourinary Comments: Chaperoned exam. Normal external exam. Vaginal canal- erythema, small amount of thick white discharge vs terconazole cream. No bleeding.   Musculoskeletal: Normal range of motion.  Neurological: She is alert and oriented to person, place, and time.  Skin: Skin is warm and dry.    Psychiatric: She has a normal mood and affect. Her behavior is normal.  Nursing note and vitals reviewed.    UC Treatments / Results  Labs (all labs ordered are listed, but only abnormal results are displayed) Labs Reviewed  POCT URINALYSIS DIP (MANUAL ENTRY) - Abnormal; Notable for the following components:      Result Value   Ketones, POC UA large (80) (*)    All other components within normal limits  WET PREP BY MOLECULAR PROBE  C. TRACHOMATIS/N. GONORRHOEAE RNA  POCT URINE PREGNANCY    EKG None  Radiology No results found.  Procedures Procedures (including critical care time)  Medications Ordered in UC Medications - No data to display  Initial Impression / Assessment and Plan / UC Course  I have reviewed the triage vital signs and the nursing notes.  Pertinent labs & imaging results that were available during my care of the patient were reviewed by me and considered in my medical decision making (see chart for details).     Swab for wet prep and GC/chlamydia sent Will start pt on 3 days of diflucan 200mg  once daily while labs pending.  Final Clinical Impressions(s) / UC Diagnoses   Final diagnoses:  Vaginitis and vulvovaginitis     Discharge Instructions      You will be notified of your lab results in 2-3 days, even if the results are normal.  If you do not hear back from our office, please call to inquire about your results.   Please follow up with family medicine in 1 week if not improving.     ED Prescriptions    Medication Sig Dispense Auth. Provider   fluconazole (DIFLUCAN) 200 MG tablet Take 1 tablet (200 mg total) by mouth daily. 3 tablet Lurene ShadowPhelps, Day Deery O, PA-C     Controlled Substance Prescriptions [place] Controlled Substance Registry consulted? Not Applicable   Rolla Platehelps, Dawaun Brancato O, PA-C 11/06/17 1557

## 2017-11-06 NOTE — Discharge Instructions (Signed)
°  You will be notified of your lab results in 2-3 days, even if the results are normal.  If you do not hear back from our office, please call to inquire about your results.   Please follow up with family medicine in 1 week if not improving.

## 2017-11-06 NOTE — ED Triage Notes (Signed)
Pt c/o vaginal itching and discomfort. States she has been on multiple abx recently. Denies odor.

## 2017-11-07 LAB — WET PREP BY MOLECULAR PROBE
Candida species: NOT DETECTED
Gardnerella vaginalis: NOT DETECTED
MICRO NUMBER:: 90990869
SPECIMEN QUALITY:: ADEQUATE
Trichomonas vaginosis: NOT DETECTED

## 2017-11-07 LAB — C. TRACHOMATIS/N. GONORRHOEAE RNA
C. trachomatis RNA, TMA: NOT DETECTED
N. gonorrhoeae RNA, TMA: NOT DETECTED

## 2017-11-09 ENCOUNTER — Telehealth: Payer: Self-pay | Admitting: *Deleted

## 2017-11-09 MED FILL — BELVIQ 10 MG TABLET: 10 | 30 days supply | Qty: 60 | Fill #2

## 2017-11-09 NOTE — Telephone Encounter (Signed)
Callback: Patient advised of lab results. She is still symptomatic. She still has one more dose of Diflucan to take. Encouraged follow up with PCP if not improving after 1 week.

## 2017-12-28 MED FILL — metFORMIN HCL 1000 MG TABS: 1000 | 90 days supply | Qty: 180 | Fill #0

## 2018-01-03 ENCOUNTER — Other Ambulatory Visit: Payer: Self-pay | Admitting: Osteopathic Medicine

## 2018-01-03 DIAGNOSIS — B379 Candidiasis, unspecified: Secondary | ICD-10-CM

## 2018-01-03 NOTE — Telephone Encounter (Signed)
Diflucan refill request  Seen at [facility] not [facility] at [facility]

## 2018-01-07 ENCOUNTER — Other Ambulatory Visit: Payer: Self-pay | Admitting: Sports Medicine

## 2018-01-07 DIAGNOSIS — F419 Anxiety disorder, unspecified: Secondary | ICD-10-CM

## 2018-01-07 NOTE — Telephone Encounter (Signed)
To PCP

## 2018-01-12 ENCOUNTER — Other Ambulatory Visit: Payer: Self-pay | Admitting: Osteopathic Medicine

## 2018-01-12 ENCOUNTER — Encounter: Payer: Self-pay | Admitting: Osteopathic Medicine

## 2018-01-14 MED ORDER — TERCONAZOLE 0.8 % VA CREA: 1.0000 | TOPICAL_CREAM | Freq: Every day | VAGINAL | 0 refills | Status: AC

## 2018-01-14 MED FILL — TERCONAZOLE 0.8% VAGINAL CR: 0.8 | 3 days supply | Qty: 20 | Fill #0

## 2018-03-04 ENCOUNTER — Other Ambulatory Visit: Payer: Self-pay | Admitting: Osteopathic Medicine

## 2018-03-04 DIAGNOSIS — F419 Anxiety disorder, unspecified: Secondary | ICD-10-CM

## 2018-03-05 ENCOUNTER — Other Ambulatory Visit: Payer: Self-pay

## 2018-03-05 MED ORDER — IBUPROFEN 800 MG PO TABS: 800.0000 mg | ORAL_TABLET | Freq: Four times a day (QID) | ORAL | 0 refills | Status: DC | PRN

## 2018-03-05 NOTE — Telephone Encounter (Signed)
[facility] requesting med refill for alprazolam.  

## 2018-03-11 NOTE — Telephone Encounter (Signed)
Left a vm msg for pt regarding med refill sent to local pharmacy. Call back info provided.

## 2018-03-15 MED FILL — HYDROCHLOROTHIAZIDE 25 MG T: 25 | 90 days supply | Qty: 90 | Fill #0

## 2018-03-15 MED FILL — IBUPROFEN 800 MG TAB: 800 | 22 days supply | Qty: 90 | Fill #0

## 2018-03-29 MED FILL — IBUPROFEN 800 MG TAB: 800 | 22 days supply | Qty: 90 | Fill #0

## 2018-03-29 MED FILL — HYDROCHLOROTHIAZIDE 25 MG T: 25 | 90 days supply | Qty: 90 | Fill #0

## 2018-04-23 ENCOUNTER — Telehealth: Payer: Self-pay

## 2018-04-23 DIAGNOSIS — F419 Anxiety disorder, unspecified: Secondary | ICD-10-CM

## 2018-04-23 NOTE — Telephone Encounter (Signed)
Pt called requesting med refill for xanax. Pls send to [facility].

## 2018-04-25 MED ORDER — ALPRAZOLAM 1 MG PO TABS: ORAL_TABLET | ORAL | 0 refills | Status: DC

## 2018-06-03 ENCOUNTER — Other Ambulatory Visit: Payer: Self-pay | Admitting: Osteopathic Medicine

## 2018-06-03 NOTE — Telephone Encounter (Signed)
[facility] requesting med refill for alprazolam.  

## 2018-06-04 NOTE — Telephone Encounter (Signed)
Left a detailed vm msg for pt regarding med refill sent to local pharmacy. Direct call back info provided.  

## 2018-07-19 ENCOUNTER — Other Ambulatory Visit: Payer: Self-pay | Admitting: Osteopathic Medicine

## 2018-07-19 NOTE — Telephone Encounter (Signed)
Refilled

## 2018-07-19 NOTE — Telephone Encounter (Signed)
Please advise 

## 2018-09-07 ENCOUNTER — Other Ambulatory Visit: Payer: Self-pay | Admitting: Osteopathic Medicine

## 2018-10-10 ENCOUNTER — Encounter: Payer: Self-pay | Admitting: Osteopathic Medicine

## 2018-10-10 ENCOUNTER — Telehealth (INDEPENDENT_AMBULATORY_CARE_PROVIDER_SITE_OTHER): Payer: Self-pay | Admitting: Osteopathic Medicine

## 2018-10-10 VITALS — BP 138/63 | HR 78 | Temp 96.8°F | Wt 204.0 lb

## 2018-10-10 DIAGNOSIS — G43109 Migraine with aura, not intractable, without status migrainosus: Secondary | ICD-10-CM

## 2018-10-10 DIAGNOSIS — I10 Essential (primary) hypertension: Secondary | ICD-10-CM

## 2018-10-10 DIAGNOSIS — J45990 Exercise induced bronchospasm: Secondary | ICD-10-CM

## 2018-10-10 MED ORDER — PROPRANOLOL HCL ER 80 MG PO CP24: 80.0000 mg | ORAL_CAPSULE | Freq: Every day | ORAL | 3 refills | Status: DC

## 2018-10-10 MED ORDER — METFORMIN HCL 1000 MG PO TABS: 1000.0000 mg | ORAL_TABLET | Freq: Two times a day (BID) | ORAL | 3 refills | Status: DC

## 2018-10-10 MED ORDER — HYDROCHLOROTHIAZIDE 25 MG PO TABS: 25.0000 mg | ORAL_TABLET | Freq: Every day | ORAL | 3 refills | Status: DC

## 2018-10-10 MED ORDER — ALPRAZOLAM 0.5 MG PO TABS: 0.2500 mg | ORAL_TABLET | Freq: Two times a day (BID) | ORAL | 0 refills | Status: DC | PRN

## 2018-10-10 NOTE — Progress Notes (Signed)
Virtual Visit via Video (App used: MyChart) Note  I connected with      Brittany Anderson on 10/10/18 at 8:34 AM by a telemedicine application and verified that I am speaking with the correct person using two identifiers.  Patient is at home I am in office   I discussed the limitations of evaluation and management by telemedicine and the availability of in person appointments. The patient expressed understanding and agreed to proceed.  History of Present Illness: Brittany Anderson is a 33 y.o. female who would like to discuss medication refills    Has been about a year since I have last seen the patient.  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 Has been having difficulty sleeping worrying about COVID exposure. Alprazolam as needed is helping, She has been on AMbien and Lunesta in the past and did ok on these medicines. Temazepam and Trazodone caoused daytime grogginess. PDMP reviewed: over past year, refill of alprazolam #30 was 07/19/2018, 06/04/2018, 04/27/2018, 03/05/2018, 01/07/2018, 11/09/2017, 10/08/2017.   Migraines: was taking Fioricet as needed but really hasn't had a migraine for the better part of a year.   Asthma: Taking albuterol as needed, very rarely.   Hypertension: Taking hydrochlorothiazide 25 mg daily, propranolol ER 80 mg daily  Type 2 diabetes: Last A1c was 11 months ago at 6.0, patient is taking metformin 1000 mg bid    Depression screen [facility] 2/9 10/10/2018 04/26/2017 11/02/2016  Decreased Interest 1 0 0  Down, Depressed, Hopeless 0 0 0  PHQ - 2 Score 1 0 0  Altered sleeping 2 3 -  Tired, decreased energy 0 0 -  Change in appetite 0 0 -  Feeling bad or failure about yourself  0 0 -  Trouble concentrating 1 0 -  Moving slowly or fidgety/restless 0 0 -  Suicidal thoughts 0 0 -  PHQ-9 Score 4 3 -  Difficult doing work/chores Not difficult at all Not difficult at all -   GAD 7 : Generalized Anxiety Score 10/10/2018  04/26/2017 11/02/2016  Nervous, Anxious, on Edge 0 1 1  Control/stop worrying 2 2 0  Worry too much - different things 2 2 0  Trouble relaxing 2 0 3  Restless 0 0 0  Easily annoyed or irritable 0 0 1  Afraid - awful might happen 0 0 0  Total GAD 7 Score 6 5 5   Anxiety Difficulty Not difficult at all Somewhat difficult -           Observations/Objective: BP 138/63 (Patient Position: Sitting, Cuff Size: Normal)   Pulse 78   Temp (!) 96.8 F (36 C) (Oral)   Wt 204 lb (92.5 kg)   BMI 31.95 kg/m  BP Readings from Last 3 Encounters:  10/10/18 138/63  11/06/17 132/86  10/24/17 125/73   Exam: Normal Speech.  NAD  Lab and Radiology Results No results found for this or any previous visit (from the past 72 hour(s)). No results found.     Assessment and Plan: 33 y.o. female with The primary encounter diagnosis was Essential hypertension. Diagnoses of Migraine with aura and without status migrainosus, not intractable, Exercise-induced asthma, and Hypertension goal BP (blood pressure) < 130/80 were also pertinent to this visit.   PDMP not reviewed this encounter. Orders Placed This Encounter  Procedures  . CBC  . COMPLETE METABOLIC PANEL WITH GFR  . Lipid panel  . TSH  . Hemoglobin A1c   Meds ordered this  encounter  Medications  . ALPRAZolam (XANAX) 0.5 MG tablet    Sig: Take 0.5-1 tablets (0.25-0.5 mg total) by mouth 2 (two) times daily as needed for anxiety.    Dispense:  30 tablet    Refill:  0    Not to exceed 5 additional fills before 12/01/2018  . hydrochlorothiazide (HYDRODIURIL) 25 MG tablet    Sig: Take 1 tablet (25 mg total) by mouth daily.    Dispense:  90 tablet    Refill:  3  . metFORMIN (GLUCOPHAGE) 1000 MG tablet    Sig: Take 1 tablet (1,000 mg total) by mouth 2 (two) times daily with a meal.    Dispense:  180 tablet    Refill:  3  . propranolol ER (INDERAL LA) 80 MG 24 hr capsule    Sig: Take 1 capsule (80 mg total) by mouth daily.    Dispense:   90 capsule    Refill:  3        Follow Up Instructions: Return for RECHECK PENDING RESULTS / IF WORSE OR CHANGE.    I discussed the assessment and treatment plan with the patient. The patient was provided an opportunity to ask questions and all were answered. The patient agreed with the plan and demonstrated an understanding of the instructions.   The patient was advised to call back or seek an in-person evaluation if any new concerns, if symptoms worsen or if the condition fails to improve as anticipated.  25 minutes of non-face-to-face time was provided during this encounter.                      Historical information moved to improve visibility of documentation.  Past Medical History:  Diagnosis Date  . Diabetes mellitus without complication (HCC)   . Hypertension    Past Surgical History:  Procedure Laterality Date  . APPENDECTOMY     Social History   Tobacco Use  . Smoking status: Former Smoker    Quit date: 2009    Years since quitting: 11.5  . Smokeless tobacco: Never Used  Substance Use Topics  . Alcohol use: Yes   family history includes Alcohol abuse in her maternal grandfather; Depression in her maternal grandfather and mother; Diabetes in her father and mother; Hypertension in her father, maternal grandmother, and mother; Stroke in her maternal grandfather.  Medications: Current Outpatient Medications  Medication Sig Dispense Refill  . ALPRAZolam (XANAX) 0.5 MG tablet Take 0.5-1 tablets (0.25-0.5 mg total) by mouth 2 (two) times daily as needed for anxiety. 30 tablet 0  . hydrochlorothiazide (HYDRODIURIL) 25 MG tablet Take 1 tablet (25 mg total) by mouth daily. 90 tablet 3  . ibuprofen (ADVIL,MOTRIN) 800 MG tablet Take 1 tablet (800 mg total) by mouth every 6 (six) hours as needed. 90 tablet 0  . metFORMIN (GLUCOPHAGE) 1000 MG tablet Take 1 tablet (1,000 mg total) by mouth 2 (two) times daily with a meal. 180 tablet 3  . propranolol ER  (INDERAL LA) 80 MG 24 hr capsule Take 1 capsule (80 mg total) by mouth daily. 90 capsule 3   No current facility-administered medications for this visit.    Allergies  Allergen Reactions  . Cyclobenzaprine Itching    REACTION: itching  . Hydrocodone-Acetaminophen Itching    REACTION: itching  . Hydrocodone-Ibuprofen Itching  . Cyclobenzaprine Hcl     REACTION: itching  . Hydrocodone-Acetaminophen     REACTION: itching    PDMP not reviewed this encounter. Orders Placed  This Encounter  Procedures  . CBC  . COMPLETE METABOLIC PANEL WITH GFR  . Lipid panel  . TSH  . Hemoglobin A1c   Meds ordered this encounter  Medications  . ALPRAZolam (XANAX) 0.5 MG tablet    Sig: Take 0.5-1 tablets (0.25-0.5 mg total) by mouth 2 (two) times daily as needed for anxiety.    Dispense:  30 tablet    Refill:  0    Not to exceed 5 additional fills before 12/01/2018  . hydrochlorothiazide (HYDRODIURIL) 25 MG tablet    Sig: Take 1 tablet (25 mg total) by mouth daily.    Dispense:  90 tablet    Refill:  3  . metFORMIN (GLUCOPHAGE) 1000 MG tablet    Sig: Take 1 tablet (1,000 mg total) by mouth 2 (two) times daily with a meal.    Dispense:  180 tablet    Refill:  3  . propranolol ER (INDERAL LA) 80 MG 24 hr capsule    Sig: Take 1 capsule (80 mg total) by mouth daily.    Dispense:  90 capsule    Refill:  3

## 2018-10-30 MED FILL — PROPRANOLOL HCL ER 80 MG CP: 80 | 90 days supply | Qty: 90 | Fill #0

## 2018-10-30 MED FILL — HYDROCHLOROTHIAZIDE 25 MG T: 25 | 90 days supply | Qty: 90 | Fill #0

## 2018-11-18 MED FILL — metFORMIN HCL 1000 MG TABS: 1000 | 90 days supply | Qty: 180 | Fill #0

## 2018-12-01 ENCOUNTER — Other Ambulatory Visit: Payer: Self-pay | Admitting: Osteopathic Medicine

## 2018-12-02 NOTE — Telephone Encounter (Signed)
[facility] requesting med [facility] for al[facility]razolam.  

## 2019-01-13 ENCOUNTER — Other Ambulatory Visit: Payer: Self-pay | Admitting: Osteopathic Medicine

## 2019-02-19 ENCOUNTER — Other Ambulatory Visit: Payer: Self-pay | Admitting: Osteopathic Medicine

## 2019-02-19 NOTE — Telephone Encounter (Signed)
Requested medication (s) are due for refill today: yes  Requested medication (s) are on the active medication list: yes  Last refill:  01/14/2019  Future visit scheduled:no  Notes to clinic: 2nd request   Requested Prescriptions  Pending Prescriptions Disp Refills   ALPRAZolam (XANAX) 0.5 MG tablet [Pharmacy Med Name: ALPRAZOLAM 0.5 MG TABLET] 30 tablet 0    Sig: TAKE 1/2 TO 1 TABLET BY MOUTH 2 TIMES DAILY AS NEEDED FOR ANXIETY.     Not Delegated - Psychiatry:  Anxiolytics/Hypnotics Failed - 02/19/2019  1:59 PM      Failed - This refill cannot be delegated      Failed - Urine Drug Screen completed in last 360 days.      Passed - Valid encounter within last 6 months    Recent Outpatient Visits          4 months ago Essential hypertension   [place] [facility], [place], DO   1 year ago Screening for STDs (sexually transmitted diseases)   [place] [facility], Lanelle Bal, DO   1 year ago Type 2 diabetes mellitus without complication, without long-term current use of insulin [facility])   [place] [facility] [place], Lanelle Bal, DO   1 year ago [place], Lanelle Bal, DO   2 years ago Diabetes mellitus without complication [facility])   [place], Lanelle Bal, DO

## 2019-02-19 NOTE — Telephone Encounter (Signed)
[facility] requesting med refill for alprazolam.  

## 2019-02-21 MED FILL — metFORMIN HCL 1000 MG TABS: 1000 | 90 days supply | Qty: 180 | Fill #1

## 2019-02-21 MED FILL — PROPRANOLOL HCL ER 80 MG CP: 80 | 90 days supply | Qty: 90 | Fill #1

## 2019-02-21 MED FILL — HYDROCHLOROTHIAZIDE 25 MG T: 25 | 90 days supply | Qty: 90 | Fill #1

## 2019-04-01 ENCOUNTER — Other Ambulatory Visit: Payer: Self-pay | Admitting: Osteopathic Medicine

## 2019-04-01 MED ORDER — IBUPROFEN 800 MG PO TABS: 800.0000 mg | ORAL_TABLET | Freq: Four times a day (QID) | ORAL | 0 refills | Status: DC | PRN

## 2019-04-01 MED FILL — IBUPROFEN 800 MG TAB: 800 | 23 days supply | Qty: 90 | Fill #0
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
## 2019-04-08 MED FILL — PHENTERMINE 37.5 MG TABLET: 37.5 | 60 days supply | Qty: 60 | Fill #0

## 2019-05-07 ENCOUNTER — Other Ambulatory Visit: Payer: Self-pay | Admitting: Osteopathic Medicine

## 2019-05-07 NOTE — Telephone Encounter (Signed)
Notes to clinic:  This Rx is not able to be delegated.    Requested Prescriptions  Pending Prescriptions Disp Refills   ALPRAZolam (XANAX) 0.5 MG tablet [Pharmacy Med Name: ALPRAZOLAM 0.5 MG TABLET] 30 tablet 0    Sig: TAKE 1/2 TO 1 TABLET BY MOUTH 2 TIMES DAILY AS NEEDED FOR ANXIETY.      Not Delegated - Psychiatry:  Anxiolytics/Hypnotics Failed - 05/07/2019  8:56 PM      Failed - This refill cannot be delegated      Failed - Urine Drug Screen completed in last 360 days.      Failed - Valid encounter within last 6 months    Recent Outpatient Visits           6 months ago Essential hypertension   [place] [facility], [place], DO   1 year ago Screening for STDs (sexually transmitted diseases)   [place] [facility], Dorene Grebe, DO   1 year ago Type 2 diabetes mellitus without complication, without long-term current use of insulin [facility])   [place] [facility], Dorene Grebe, DO   2 years ago Dysuria   [facility], Dorene Grebe, DO   2 years ago Diabetes mellitus without complication [facility])   [place] [facility], Dorene Grebe, DO

## 2019-06-17 ENCOUNTER — Other Ambulatory Visit: Payer: Self-pay | Admitting: Osteopathic Medicine

## 2019-06-17 NOTE — Telephone Encounter (Signed)
[facility] requesting med refill for alprazolam.  

## 2019-06-27 ENCOUNTER — Other Ambulatory Visit: Payer: Self-pay | Admitting: Osteopathic Medicine

## 2019-06-27 MED FILL — HYDROCHLOROTHIAZIDE 25 MG T: 25 | 90 days supply | Qty: 90 | Fill #2

## 2019-07-04 MED FILL — HYDROCHLOROTHIAZIDE 25 MG T: 25 | 90 days supply | Qty: 90 | Fill #2

## 2019-07-04 MED FILL — IBUPROFEN 800 MG TAB: 800 | 22 days supply | Qty: 90 | Fill #0

## 2019-10-03 ENCOUNTER — Ambulatory Visit (INDEPENDENT_AMBULATORY_CARE_PROVIDER_SITE_OTHER): Payer: No Typology Code available for payment source | Admitting: Sports Medicine

## 2019-10-03 ENCOUNTER — Ambulatory Visit (INDEPENDENT_AMBULATORY_CARE_PROVIDER_SITE_OTHER): Payer: No Typology Code available for payment source

## 2019-10-03 ENCOUNTER — Other Ambulatory Visit: Payer: Self-pay

## 2019-10-03 DIAGNOSIS — N62 Hypertrophy of breast: Secondary | ICD-10-CM | POA: Diagnosis not present

## 2019-10-03 DIAGNOSIS — G5603 Carpal tunnel syndrome, bilateral upper limbs: Secondary | ICD-10-CM | POA: Diagnosis not present

## 2019-10-03 DIAGNOSIS — M25552 Pain in left hip: Secondary | ICD-10-CM

## 2019-10-03 MED ORDER — GABAPENTIN 300 MG PO CAPS: ORAL_CAPSULE | ORAL | 3 refills | Status: DC

## 2019-10-03 MED ORDER — CELECOXIB 200 MG PO CAPS: ORAL_CAPSULE | ORAL | 2 refills | Status: DC

## 2019-10-03 MED FILL — GABAPENTIN 300 MG CAPSULE: 300 | 17 days supply | Qty: 30 | Fill #0

## 2019-10-03 MED FILL — HYDROCHLOROTHIAZIDE 25 MG T: 25 | 30 days supply | Qty: 30 | Fill #3

## 2019-10-03 MED FILL — CELECOXIB 200 MG CAP: 200 | 30 days supply | Qty: 60 | Fill #0

## 2019-10-03 NOTE — Progress Notes (Addendum)
    Procedures performed today:    None.  Independent interpretation of notes and tests performed by another provider:   None.  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 Return to see me in 4 to 6 weeks, injections if no better. She does endorsed some numbness as well over the lateral hip that may be consistent with meralgia paresthetica.  Update: Patient desires referral to orthopedic surgery  Macromastia Referral to plastic surgery for consideration of reduction mammoplasty, chronic neck pain, shoulder grooving.  Carpal tunnel syndrome, bilateral Bilateral hand and thenar eminence pain, worse at night, positive Phalen sign. She will do bilateral nighttime splinting, she has splints at home. I am going to add some gabapentin, nightly. Carpal tunnel rehab exercises, return in 6 weeks, median nerve Hydro dissection if no better.    ___________________________________________ Ihor Austin. Benjamin Stain, M.D., ABFM., CAQSM. Primary Care and Sports Medicine [facility]  Adjunct Instructor of Family Medicine  [employer]

## 2019-10-03 NOTE — Assessment & Plan Note (Addendum)
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 Return to see me in 4 to 6 weeks, injections if no better. She does endorsed some numbness as well over the lateral hip that may be consistent with meralgia paresthetica.  Update: Patient desires referral to orthopedic surgery

## 2019-10-03 NOTE — Assessment & Plan Note (Signed)
Bilateral hand and thenar eminence pain, worse at night, positive Phalen sign. She will do bilateral nighttime splinting, she has splints at home. I am going to add some gabapentin, nightly. Carpal tunnel rehab exercises, return in 6 weeks, median nerve Hydro dissection if no better.

## 2019-10-03 NOTE — Assessment & Plan Note (Signed)
Referral to plastic surgery for consideration of reduction mammoplasty, chronic neck pain, shoulder grooving.

## 2019-10-04 LAB — CBC
HCT: 39.8 % (ref 35.0–45.0)
Hemoglobin: 13.1 g/dL (ref 11.7–15.5)
MCH: 28.7 pg (ref 27.0–33.0)
MCHC: 32.9 g/dL (ref 32.0–36.0)
MCV: 87.3 fL (ref 80.0–100.0)
MPV: 11.1 fL (ref 7.5–12.5)
Platelets: 327 10*3/uL (ref 140–400)
RBC: 4.56 10*6/uL (ref 3.80–5.10)
RDW: 11.9 % (ref 11.0–15.0)
WBC: 7.5 10*3/uL (ref 3.8–10.8)

## 2019-10-04 LAB — TSH: TSH: 1.29 mIU/L

## 2019-10-04 LAB — COMPLETE METABOLIC PANEL WITH GFR
AG Ratio: 2 (calc) (ref 1.0–2.5)
ALT: 8 U/L (ref 6–29)
AST: 12 U/L (ref 10–30)
Albumin: 4.6 g/dL (ref 3.6–5.1)
Alkaline phosphatase (APISO): 55 U/L (ref 31–125)
BUN: 17 mg/dL (ref 7–25)
CO2: 25 mmol/L (ref 20–32)
Calcium: 9.9 mg/dL (ref 8.6–10.2)
Chloride: 104 mmol/L (ref 98–110)
Creat: 0.68 mg/dL (ref 0.50–1.10)
GFR, Est African American: 130 mL/min/{1.73_m2} (ref >=60)
GFR, Est Non African American: 113 mL/min/{1.73_m2} (ref >=60)
Globulin: 2.3 g/dL (calc) (ref 1.9–3.7)
Glucose, Bld: 155 mg/dL — ABNORMAL HIGH (ref 65–99)
Potassium: 4.2 mmol/L (ref 3.5–5.3)
Sodium: 140 mmol/L (ref 135–146)
Total Bilirubin: 0.3 mg/dL (ref 0.2–1.2)
Total Protein: 6.9 g/dL (ref 6.1–8.1)

## 2019-10-04 LAB — LIPID PANEL
Cholesterol: 193 mg/dL (ref <200)
HDL: 39 mg/dL — ABNORMAL LOW (ref >=50)
LDL Cholesterol (Calc): 136 mg/dL (calc) — ABNORMAL HIGH
Non-HDL Cholesterol (Calc): 154 mg/dL (calc) — ABNORMAL HIGH (ref <130)
Total CHOL/HDL Ratio: 4.9 (calc) (ref <5.0)
Triglycerides: 82 mg/dL (ref <150)

## 2019-10-04 LAB — HEMOGLOBIN A1C
Hgb A1c MFr Bld: 6.4 % of total Hgb — ABNORMAL HIGH (ref <5.7)
Mean Plasma Glucose: 137 (calc)
eAG (mmol/L): 7.6 (calc)

## 2019-10-07 NOTE — Addendum Note (Signed)
Addended by: Monica Becton on: 10/07/2019 11:22 AM   Modules accepted: Orders

## 2019-11-07 ENCOUNTER — Encounter: Payer: Self-pay | Admitting: Osteopathic Medicine

## 2019-11-07 ENCOUNTER — Other Ambulatory Visit: Payer: Self-pay | Admitting: Osteopathic Medicine

## 2019-11-07 DIAGNOSIS — I10 Essential (primary) hypertension: Secondary | ICD-10-CM

## 2019-11-07 MED FILL — HYDROCHLOROTHIAZIDE 25 MG T: 25 | 30 days supply | Qty: 30 | Fill #0

## 2019-11-07 MED FILL — GABAPENTIN 300 MG CAPSULE: 300 | 17 days supply | Qty: 30 | Fill #1

## 2019-11-13 MED FILL — CELECOXIB 200 MG CAP: 200 | 30 days supply | Qty: 60 | Fill #1

## 2019-11-14 ENCOUNTER — Ambulatory Visit: Payer: No Typology Code available for payment source | Admitting: Sports Medicine

## 2019-12-12 MED FILL — GABAPENTIN 300 MG CAPSULE: 300 | 17 days supply | Qty: 30 | Fill #2

## 2019-12-12 MED FILL — CELECOXIB 200 MG CAP: 200 | 30 days supply | Qty: 60 | Fill #2

## 2019-12-16 ENCOUNTER — Ambulatory Visit: Payer: No Typology Code available for payment source | Attending: Internal Medicine

## 2019-12-16 ENCOUNTER — Other Ambulatory Visit (HOSPITAL_BASED_OUTPATIENT_CLINIC_OR_DEPARTMENT_OTHER): Payer: Self-pay | Admitting: Internal Medicine

## 2019-12-16 DIAGNOSIS — Z23 Encounter for immunization: Secondary | ICD-10-CM

## 2019-12-16 NOTE — Progress Notes (Signed)
   Covid-19 Vaccination Clinic  Name:  Brittany Anderson    MRN: [id number] DOB: [birth year]  12/16/2019  [name] was observed post Covid-19 immunization for 15 minutes without incident. She was provided with Vaccine Information Sheet and instruction to access the V-Safe system.  Vaccinated by [facility]  [name] was instructed to call 911 with any severe reactions post vaccine: Marland Kitchen Difficulty breathing  . Swelling of face and throat  . A fast heartbeat  . A bad rash all over body  . Dizziness and weakness   Immunizations Administered    Name Date Dose VIS Date Route   JANSSEN COVID-19 VACCINE 12/16/2019 12:42 PM 0.5 mL 05/17/2019 Intramuscular   Manufacturer: [employer]   Lot: [id number]   NDC: 27062-376-28

## 2019-12-19 MED FILL — JANSSEN COVID-19 VACCINE 0.: 0.5 | 1 days supply | Qty: 1 | Fill #0

## 2020-01-19 ENCOUNTER — Other Ambulatory Visit: Payer: Self-pay | Admitting: Sports Medicine

## 2020-01-19 DIAGNOSIS — M25552 Pain in left hip: Secondary | ICD-10-CM

## 2020-01-19 MED FILL — CELECOXIB 200 MG CAPS: 200 | 30 days supply | Qty: 60 | Fill #0

## 2020-02-17 ENCOUNTER — Other Ambulatory Visit: Payer: Self-pay | Admitting: Osteopathic Medicine

## 2020-02-19 ENCOUNTER — Telehealth: Payer: Self-pay

## 2020-02-19 NOTE — Telephone Encounter (Signed)
Task completed. Pt informed of provider's note regarding letter of exemption/allergy. Pt agreeable with plan. Transferred to front desk for scheduling.

## 2020-02-19 NOTE — Telephone Encounter (Signed)
Pt called stating she needs a work note with record of allergies to the Flu vaccine and other vaccines. Per pt, she will need the letter within 48 hrs before she is terminated from her position. Pls send note via MyChart.

## 2020-02-19 NOTE — Telephone Encounter (Signed)
Weakness as documented is not an allergy to vaccines (see below)  Needs visit No guarantee I will write exemption   Allergies  Allergen Reactions   Cyclobenzaprine Itching and Other (See Comments)    REACTION: itching Other REACTION: itching REACTION: itching    Hydrocodone-Acetaminophen Itching and Other (See Comments)    REACTION: itching Other REACTION: itching REACTION: itching    Hydrocodone-Ibuprofen Itching   Flu Virus Vaccine Other (See Comments)    Weakness   Hemophilus B Polysaccharide Vaccine Other (See Comments)    Weakness   Benadryl [Diphenhydramine] Other (See Comments)    palpitations   Cyclobenzaprine Hcl     REACTION: itching   Hydrocodone-Acetaminophen     REACTION: itching   Other Other (See Comments)    Other

## 2020-02-20 ENCOUNTER — Encounter: Payer: No Typology Code available for payment source | Admitting: Osteopathic Medicine

## 2020-02-20 ENCOUNTER — Other Ambulatory Visit: Payer: Self-pay

## 2020-02-20 ENCOUNTER — Encounter: Payer: Self-pay | Admitting: Osteopathic Medicine

## 2020-02-20 ENCOUNTER — Other Ambulatory Visit: Payer: Self-pay | Admitting: Osteopathic Medicine

## 2020-02-20 ENCOUNTER — Ambulatory Visit (INDEPENDENT_AMBULATORY_CARE_PROVIDER_SITE_OTHER): Payer: No Typology Code available for payment source | Admitting: Osteopathic Medicine

## 2020-02-20 VITALS — BP 123/78 | HR 78 | Temp 97.8°F | Wt 206.0 lb

## 2020-02-20 DIAGNOSIS — T50B95D Adverse effect of other viral vaccines, subsequent encounter: Secondary | ICD-10-CM

## 2020-02-20 DIAGNOSIS — I10 Essential (primary) hypertension: Secondary | ICD-10-CM

## 2020-02-20 DIAGNOSIS — M25552 Pain in left hip: Secondary | ICD-10-CM

## 2020-02-20 DIAGNOSIS — E119 Type 2 diabetes mellitus without complications: Secondary | ICD-10-CM

## 2020-02-20 MED ORDER — CELECOXIB 200 MG PO CAPS: ORAL_CAPSULE | ORAL | 2 refills | Status: DC

## 2020-02-20 MED ORDER — HYDROCHLOROTHIAZIDE 25 MG PO TABS: 25.0000 mg | ORAL_TABLET | Freq: Every day | ORAL | 3 refills | Status: DC

## 2020-02-20 MED FILL — HYDROCHLOROTHIAZIDE 25 MG T: 25 | 30 days supply | Qty: 30 | Fill #0

## 2020-02-20 MED FILL — CELECOXIB 200 MG CAP: 200 | 30 days supply | Qty: 60 | Fill #0

## 2020-02-20 NOTE — Progress Notes (Signed)
Brittany Anderson is a 34 y.o. female who presents to  [facility] at [facility]  today, 02/20/20, seeking care for the following:  . 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 Diagnoses of Hypertension goal BP (blood pressure) < 130/80, Left hip pain, Essential hypertension, and Type 2 diabetes mellitus without complication, without long-term current use of insulin (HCC) were also pertinent to this visit.   Sounds more like normal imune response to vaccination, no anaphylaxis. I filled out paperwork to this effect, see scanned documents  No results found for this or any previous visit (from the past 24 hour(s)).   Patient Instructions  Plan:  Forms attached, copy made here for your chart.  BP: message set to remind you in 1 week to send me BP numbers  [facility]  Recheck pending lab / BP results!      Orders Placed This Encounter  Procedures  . CBC  . COMPLETE METABOLIC PANEL WITH GFR  . Lipid panel  . Hemoglobin A1c    Meds ordered this encounter  Medications  . DISCONTD: hydrochlorothiazide (HYDRODIURIL) 25 MG tablet    Sig: Take 1 tablet (25 mg total) by mouth daily.    Dispense:  90 tablet    Refill:  3  . DISCONTD: celecoxib (CELEBREX) 200 MG capsule    Sig: TAKE 1 OR 2 CAPSULES BY MOUTH DAILY AS NEEDED FOR PAIN    Dispense:  60 capsule    Refill:  2  . hydrochlorothiazide (HYDRODIURIL) 25 MG tablet    Sig: Take 1 tablet (25 mg total) by mouth daily.    Dispense:  90 tablet    Refill:  3  . celecoxib (CELEBREX) 200 MG  capsule    Sig: TAKE 1 OR 2 CAPSULES BY MOUTH DAILY AS NEEDED FOR PAIN    Dispense:  60 capsule    Refill:  2       Follow-up instructions: Return for RECHECK PENDING RESULTS - NO LATER THAN 09/2020 FOR ANNUAL PHYSICAL .                                         BP 123/78 (BP Location: Left Arm, Patient Position: Sitting, Cuff Size: Large)   Pulse 78   Temp 97.8 F (36.6 C) (Oral)   Wt 206 lb (93.4 kg)   BMI 32.26 kg/m   Current Meds  Medication Sig  . ALPRAZolam (XANAX) 0.5 MG tablet TAKE 1/2 TO 1 TABLET BY MOUTH 2 TIMES DAILY AS NEEDED FOR ANXIETY.  . celecoxib (CELEBREX) 200 MG capsule TAKE 1 OR 2 CAPSULES BY MOUTH DAILY AS NEEDED FOR PAIN  . gabapentin (NEURONTIN) 300 MG capsule One tab PO qHS for a week, then BID for a week, then TID. May double weekly to a max of 3,600mg /day  . hydrochlorothiazide (HYDRODIURIL) 25 MG tablet Take 1 tablet (25 mg total) by mouth daily.  . metFORMIN (GLUCOPHAGE) 1000 MG tablet Take 1 tablet (  1,000 mg total) by mouth 2 (two) times daily with a meal.  . [DISCONTINUED] celecoxib (CELEBREX) 200 MG capsule TAKE 1 OR 2 CAPSULES BY MOUTH DAILY AS NEEDED FOR PAIN  . [DISCONTINUED] celecoxib (CELEBREX) 200 MG capsule TAKE 1 OR 2 CAPSULES BY MOUTH DAILY AS NEEDED FOR PAIN  . [DISCONTINUED] hydrochlorothiazide (HYDRODIURIL) 25 MG tablet TAKE 1 TABLET BY MOUTH ONCE DAILY  . [DISCONTINUED] hydrochlorothiazide (HYDRODIURIL) 25 MG tablet Take 1 tablet (25 mg total) by mouth daily.    No results found for this or any previous visit (from the past 72 hour(s)).  No results found.     All questions at time of visit were answered - patient instructed to contact office with any additional concerns or updates.  ER/RTC precautions were reviewed with the patient as applicable.   Please note: voice recognition software was used to produce this document, and typos may escape review. Please contact Dr. Lyn Hollingshead for any needed  clarifications.

## 2020-02-20 NOTE — Patient Instructions (Addendum)
Plan:  Forms attached, copy made here for your chart.  BP: message set to remind you in 1 week to send me BP numbers  [facility]  Recheck pending lab / BP results!

## 2020-02-27 ENCOUNTER — Encounter: Payer: Self-pay | Admitting: Osteopathic Medicine

## 2020-02-27 DIAGNOSIS — F988 Other specified behavioral and emotional disorders with onset usually occurring in childhood and adolescence: Secondary | ICD-10-CM

## 2020-02-27 DIAGNOSIS — R41840 Attention and concentration deficit: Secondary | ICD-10-CM

## 2020-02-27 NOTE — Telephone Encounter (Signed)
Routing to covering provider. [facility] referral pended.

## 2020-02-29 NOTE — Telephone Encounter (Signed)
Referral signed.

## 2020-03-04 ENCOUNTER — Other Ambulatory Visit: Payer: Self-pay | Admitting: Osteopathic Medicine

## 2020-03-04 MED ORDER — LOSARTAN POTASSIUM 25 MG PO TABS: 25.0000 mg | ORAL_TABLET | Freq: Every day | ORAL | 1 refills | Status: DC

## 2020-03-04 MED FILL — LOSARTAN POTASSIUM 25 MG TA: 25 | 30 days supply | Qty: 30 | Fill #0

## 2020-03-24 ENCOUNTER — Other Ambulatory Visit: Payer: Self-pay | Admitting: Osteopathic Medicine

## 2020-03-24 MED FILL — CELECOXIB 200 MG CAPS: 200 | 30 days supply | Qty: 60 | Fill #0

## 2020-03-24 MED FILL — LOSARTAN POTASSIUM 25 MG TA: 25 | 30 days supply | Qty: 30 | Fill #0

## 2020-03-24 MED FILL — HYDROCHLOROTHIAZIDE 25 MG T: 25 | 30 days supply | Qty: 30 | Fill #0

## 2020-03-24 MED FILL — METFORMIN HCL 1000 MG TABS: 1000 | 30 days supply | Qty: 60 | Fill #0

## 2020-03-26 NOTE — Telephone Encounter (Signed)
Referral sent to [facility] as requested. - CF

## 2020-03-26 NOTE — Telephone Encounter (Signed)
From referral   Dr. Gilmore Laroche denied referral at this time. If patient can have testing 1st, after testing Dr. Gilmore Laroche will agree to see her. Try [facility]. Or [facility]      Can you forward to other place for testing please?

## 2020-03-31 ENCOUNTER — Other Ambulatory Visit: Payer: Self-pay | Admitting: Osteopathic Medicine

## 2020-03-31 MED ORDER — LISINOPRIL 10 MG PO TABS: 10.0000 mg | ORAL_TABLET | Freq: Every day | ORAL | 3 refills | Status: DC

## 2020-03-31 NOTE — Telephone Encounter (Signed)
5 mins spent billed  

## 2020-04-15 MED FILL — LISINOPRIL 10 MG TABS: 10 | 30 days supply | Qty: 30 | Fill #0

## 2020-04-27 MED FILL — CELECOXIB 200 MG CAP: 200 | 30 days supply | Qty: 60 | Fill #1

## 2020-04-27 MED FILL — HYDROCHLOROTHIAZIDE 25 MG T: 25 | 30 days supply | Qty: 30 | Fill #1

## 2020-05-06 MED FILL — LOSARTAN POTASSIUM 25 MG TA: 25 | 30 days supply | Qty: 30 | Fill #0

## 2020-05-06 MED FILL — HYDROCHLOROTHIAZIDE 25 MG T: 25 | 30 days supply | Qty: 30 | Fill #1

## 2020-05-06 MED FILL — CELECOXIB 200 MG CAP: 200 | 30 days supply | Qty: 60 | Fill #1

## 2020-06-18 ENCOUNTER — Other Ambulatory Visit: Payer: Self-pay | Admitting: Osteopathic Medicine

## 2020-06-20 ENCOUNTER — Other Ambulatory Visit (HOSPITAL_BASED_OUTPATIENT_CLINIC_OR_DEPARTMENT_OTHER): Payer: Self-pay

## 2020-06-21 ENCOUNTER — Other Ambulatory Visit (HOSPITAL_BASED_OUTPATIENT_CLINIC_OR_DEPARTMENT_OTHER): Payer: Self-pay

## 2020-06-21 ENCOUNTER — Ambulatory Visit (INDEPENDENT_AMBULATORY_CARE_PROVIDER_SITE_OTHER): Payer: No Typology Code available for payment source | Admitting: Osteopathic Medicine

## 2020-06-21 ENCOUNTER — Encounter: Payer: Self-pay | Admitting: Osteopathic Medicine

## 2020-06-21 ENCOUNTER — Other Ambulatory Visit: Payer: Self-pay

## 2020-06-21 ENCOUNTER — Other Ambulatory Visit (HOSPITAL_COMMUNITY): Payer: Self-pay

## 2020-06-21 VITALS — BP 148/101 | HR 92 | Temp 98.8°F | Wt 209.0 lb

## 2020-06-21 DIAGNOSIS — B351 Tinea unguium: Secondary | ICD-10-CM

## 2020-06-21 DIAGNOSIS — E119 Type 2 diabetes mellitus without complications: Secondary | ICD-10-CM | POA: Diagnosis not present

## 2020-06-21 DIAGNOSIS — G8929 Other chronic pain: Secondary | ICD-10-CM

## 2020-06-21 DIAGNOSIS — I10 Essential (primary) hypertension: Secondary | ICD-10-CM | POA: Diagnosis not present

## 2020-06-21 DIAGNOSIS — E669 Obesity, unspecified: Secondary | ICD-10-CM

## 2020-06-21 DIAGNOSIS — M545 Low back pain, unspecified: Secondary | ICD-10-CM | POA: Diagnosis not present

## 2020-06-21 MED ORDER — HYDROCHLOROTHIAZIDE 25 MG PO TABS
ORAL_TABLET | Freq: Every day | ORAL | 3 refills | Status: AC
  Filled 2020-06-21 – 2020-10-28 (×2): qty 30, 30d supply, fill #0
  Filled 2020-11-28: qty 30, 30d supply, fill #1
  Filled 2021-02-07: qty 30, 30d supply, fill #2

## 2020-06-21 MED ORDER — TERBINAFINE HCL 250 MG PO TABS
250.0000 mg | ORAL_TABLET | Freq: Every day | ORAL | 1 refills | Status: AC
  Filled 2020-06-21: qty 30, 30d supply, fill #0

## 2020-06-21 MED ORDER — CICLOPIROX 8 % EX SOLN
Freq: Every day | CUTANEOUS | 0 refills | Status: AC
  Filled 2020-06-21: qty 6.6, 30d supply, fill #0

## 2020-06-21 MED ORDER — NALTREXONE-BUPROPION HCL ER 8-90 MG PO TB12
ORAL_TABLET | ORAL | 0 refills | Status: DC
  Filled 2020-06-21: qty 80, 28d supply, fill #0

## 2020-06-21 MED ORDER — LOSARTAN POTASSIUM 25 MG PO TABS
ORAL_TABLET | Freq: Every day | ORAL | 1 refills | Status: DC
  Filled 2020-06-21 – 2020-10-28 (×2): qty 30, 30d supply, fill #0
  Filled 2020-11-28: qty 30, 30d supply, fill #1
  Filled 2020-12-30: qty 30, 30d supply, fill #2
  Filled 2021-02-07: qty 30, 30d supply, fill #3

## 2020-06-21 MED ORDER — METFORMIN HCL 1000 MG PO TABS
ORAL_TABLET | ORAL | 0 refills | Status: DC
  Filled 2020-06-21: qty 60, 30d supply, fill #0

## 2020-06-21 MED ORDER — NALTREXONE-BUPROPION HCL ER 8-90 MG PO TB12
2.0000 | ORAL_TABLET | Freq: Two times a day (BID) | ORAL | 2 refills | Status: DC
  Filled 2020-06-21 – 2020-06-23 (×3): qty 120, 30d supply, fill #0

## 2020-06-21 MED ORDER — TRAMADOL HCL 50 MG PO TABS
50.0000 mg | ORAL_TABLET | Freq: Three times a day (TID) | ORAL | 0 refills | Status: DC | PRN
  Filled 2020-06-21: qty 20, 7d supply, fill #0

## 2020-06-21 NOTE — Progress Notes (Signed)
Brittany Anderson is a 35 y.o. female who presents to  [facility] at [facility]  today, 06/21/20, seeking care for the following:  . Onychomycosis . Weight loss Rx - has previously tried Belviq, Liraglutide, Phentermine . BP meds out, BP not at goal  . Pt reports labs done in [place] but no results were forwarded here      ASSESSMENT & PLAN with other pertinent findings:  The primary encounter diagnosis was Type 2 diabetes mellitus without complication, without long-term current use of insulin (HCC). Diagnoses of Hypertension goal BP (blood pressure) < 130/80, Obesity (BMI 30-39.9), Chronic low back pain, unspecified back pain laterality, unspecified whether sciatica present, and Onychomycosis were also pertinent to this visit.    Patient Instructions  Toenail: polish (can use GoodRx coupon if needed) daily, Lamisil pill daily for up to 3 months   Weight: Will trial Contrave, if that's not covered will send Wellbutrin   Blood pressure: refilled medications, please keep an eye on your blood pressure, goal <130/80  Anxiety: refilled Xaxax  Pain: refilled Tramadol   Labs: doesn't look like these cam through - please get me records! (can screen-shot and send in MyChart)      No orders of the defined types were placed in this encounter.   Meds ordered this encounter  Medications  . Naltrexone-buPROPion HCl ER 8-90 MG TB12    Sig: take 1 tablet by mouth daily for week 1, then 1 tablet 2 times daily for week 2, then 2 tablets every morning and 1 tablet every evening for week 3, then 2 tablets 2 times a day    Dispense:  80 tablet    Refill:  0  . Naltrexone-buPROPion HCl ER 8-90 MG TB12    Sig: Take 2 tablets by mouth 2 (two) times daily.    Dispense:  120 tablet    Refill:  2  . metFORMIN (GLUCOPHAGE) 1000 MG tablet    Sig: TAKE 1 TABLET BY MOUTH 2 TIMES DAILY WITH A MEAL **NEED LABS DRAWN FOR MORE REFILLS**    Dispense:  60  tablet    Refill:  0  . losartan (COZAAR) 25 MG tablet    Sig: TAKE 1 TABLET BY MOUTH ONCE A DAY    Dispense:  90 tablet    Refill:  1  . hydrochlorothiazide (HYDRODIURIL) 25 MG tablet    Sig: TAKE 1 TABLET BY MOUTH ONCE DAILY    Dispense:  90 tablet    Refill:  3  . traMADol (ULTRAM) 50 MG tablet    Sig: Take 1 to 2 tablets (50 mg total) by mouth every 8 (eight) hours as needed for up to 5 days for moderate pain or severe pain (max 6 tablets daily)    Dispense:  20 tablet    Refill:  0  . ciclopirox (PENLAC) 8 % solution    Sig: Apply topically at bedtime. Apply over nail and surrounding skin. Apply daily over previous coat. After seven (7) days, may remove with alcohol and continue cycle.    Dispense:  6.6 mL    Refill:  0  . terbinafine (LAMISIL) 250 MG tablet    Sig: Take 1 tablet (250 mg total) by mouth daily.    Dispense:  90 tablet    Refill:  1     See below for relevant physical exam findings  See below for recent lab and imaging results reviewed  Medications, allergies, PMH, PSH, Soc[facility], [facility]  reviewed below    Follow-up instructions: Return in about 4 months (around 10/21/2020) for follow-up when back from [facility] - will keep an eye on labs, weight, etc .                                        Exam:  BP (!) 148/101 (BP Location: Left Arm, Patient Position: Sitting, Cuff Size: Large)   Pulse 92   Temp 98.8 F (37.1 C) (Oral)   Wt 209 lb (94.8 kg)   BMI 32.73 kg/m   Constitutional: VS see above. General Appearance: alert, well-developed, well-nourished, NAD  Neck: No masses, trachea midline.   Respiratory: Normal respiratory effort. no wheeze, no rhonchi, no rales  Cardiovascular: S1/S2 normal, no murmur, no rub/gallop auscultated. RRR.   Musculoskeletal: Gait normal. Symmetric and independent movement of all extremities  Neurological: Normal balance/coordination. No tremor.  Skin: warm, dry, intact.   Psychiatric:  Normal judgment/insight. Normal mood and affect. Oriented x3.   Current Meds  Medication Sig  . ALPRAZolam (XANAX) 0.5 MG tablet TAKE 1/2 TO 1 TABLET BY MOUTH 2 TIMES DAILY AS NEEDED FOR ANXIETY.  . celecoxib (CELEBREX) 200 MG capsule TAKE 1 TO 2 CAPSULES BY MOUTH DAILY AS NEEDED FOR PAIN  . ciclopirox (PENLAC) 8 % solution Apply topically at bedtime. Apply over nail and surrounding skin. Apply daily over previous coat. After seven (7) days, may remove with alcohol and continue cycle.  Marland Kitchen COVID-19 Ad26 vaccine, JANSSEN/J&J, 0.5 ML injection INJECT AS DIRECTED  . Naltrexone-buPROPion HCl ER 8-90 MG TB12 take 1 tablet by mouth daily for week 1, then 1 tablet 2 times daily for week 2, then 2 tablets every morning and 1 tablet every evening for week 3, then 2 tablets 2 times a day  . Naltrexone-buPROPion HCl ER 8-90 MG TB12 Take 2 tablets by mouth 2 (two) times daily.  Marland Kitchen terbinafine (LAMISIL) 250 MG tablet Take 1 tablet (250 mg total) by mouth daily.  . traMADol (ULTRAM) 50 MG tablet Take 1 to 2 tablets (50 mg total) by mouth every 8 (eight) hours as needed for up to 5 days for moderate pain or severe pain (max 6 tablets daily)  . [DISCONTINUED] celecoxib (CELEBREX) 200 MG capsule TAKE 1 - 2 CAPSULES BY MOUTH DAILY AS NEEDED FOR PAIN  . [DISCONTINUED] gabapentin (NEURONTIN) 300 MG capsule One tab PO qHS for a week, then BID for a week, then TID. May double weekly to a max of 3,600mg /day  . [DISCONTINUED] hydrochlorothiazide (HYDRODIURIL) 25 MG tablet TAKE 1 TABLET BY MOUTH ONCE DAILY  . [DISCONTINUED] hydrochlorothiazide (HYDRODIURIL) 25 MG tablet TAKE 1 TABLET BY MOUTH DAILY  . [DISCONTINUED] lisinopril (ZESTRIL) 10 MG tablet TAKE 1 TABLET BY MOUTH DAILY  . [DISCONTINUED] losartan (COZAAR) 25 MG tablet TAKE 1 TABLET BY MOUTH ONCE A DAY  . [DISCONTINUED] metFORMIN (GLUCOPHAGE) 1000 MG tablet TAKE 1 TABLET BY MOUTH 2 TIMES DAILY WITH A MEAL **NEED LABS DRAWN FOR MORE REFILLS**    Allergies  Allergen  Reactions  . Cyclobenzaprine Itching and Other (See Comments)    REACTION: itching   . Hydrocodone-Acetaminophen Itching and Other (See Comments)    REACTION: itching   . Hydrocodone-Ibuprofen Itching  . Hemophilus B Polysaccharide Vaccine Other (See Comments)    Weakness  . Influenza Virus Vaccine Other (See Comments)    Weakness, Lethargic (had to get a steroid injection)  .  Benadryl [Diphenhydramine] Other (See Comments)    palpitations  . Cyclobenzaprine Hcl     REACTION: itching  . Hydrocodone-Acetaminophen     REACTION: itching  . Other Other (See Comments)    Other  . Losartan Potassium     Low BP per patient     Patient Active Problem List   Diagnosis Date Noted  . Carpal tunnel syndrome, bilateral 10/03/2019  . Macromastia 10/03/2019  . Left hip pain 10/03/2019  . Anxiety disorder 11/05/2016  . Exercise-induced asthma 09/13/2016  . BMI 35.0-35.9,adult 09/13/2016  . Uses contraception 03/19/2015  . Type 2 diabetes mellitus without complication (HCC) 02/03/2014  . Primary insomnia 12/22/2013  . Class 2 obesity 07/27/2011  . Migraine with aura and without status migrainosus, not intractable 07/27/2011  . ADD (attention deficit disorder) 07/13/2011  . Essential hypertension 07/23/2009  . SHOULDER PAIN, RIGHT 07/23/2009  . NECK SPASM 07/23/2009    Family History  Problem Relation Age of Onset  . Depression Mother   . Diabetes Mother   . Hypertension Mother   . Diabetes Father   . Hypertension Father   . Hypertension Maternal Grandmother   . Alcohol abuse Maternal Grandfather   . Depression Maternal Grandfather   . Stroke Maternal Grandfather     Social History   Tobacco Use  Smoking Status Former Smoker  . Quit date: 2009  . Years since quitting: 13.2  Smokeless Tobacco Never Used    Past Surgical History:  Procedure Laterality Date  . APPENDECTOMY      Immunization History  Administered Date(s) Administered  . Influenza Split 01/02/2012,  01/09/2013, 01/18/2015, 01/06/2016, 02/01/2017, 01/10/2018, 01/18/2019  . Influenza,inj,Quad PF,6+ Mos 01/18/2015, 01/06/2016, 01/18/2019  . Influenza,inj,quad, With Preservative 01/18/2015, 01/10/2018  . Influenza-Unspecified 01/15/2014, 02/01/2017  . Janssen (J&J) SARS-COV-2 Vaccination 12/16/2019  . Tdap 03/20/2008    No results found for this or any previous visit (from the past 2160 hour(s)).  No results found.     All questions at time of visit were answered - patient instructed to contact office with any additional concerns or updates. ER/RTC precautions were reviewed with the patient as applicable.   Please note: manual typing as well as voice recognition software may have been used to produce this document - typos may escape review. Please contact Dr. Lyn Hollingshead for any needed clarifications.

## 2020-06-21 NOTE — Patient Instructions (Addendum)
Toenail: polish (can use GoodRx coupon if needed) daily, Lamisil pill daily for up to 3 months   Weight: Will trial Contrave, if that's not covered will send Wellbutrin   Blood pressure: refilled medications, please keep an eye on your blood pressure, goal <130/80  Anxiety: refilled Xaxax  Pain: refilled Tramadol   Labs: doesn't look like these cam through - please get me records! (can screen-shot and send in MyChart)

## 2020-06-23 ENCOUNTER — Encounter: Payer: Self-pay | Admitting: Osteopathic Medicine

## 2020-06-23 ENCOUNTER — Other Ambulatory Visit (HOSPITAL_BASED_OUTPATIENT_CLINIC_OR_DEPARTMENT_OTHER): Payer: Self-pay

## 2020-06-23 ENCOUNTER — Other Ambulatory Visit (HOSPITAL_COMMUNITY): Payer: Self-pay

## 2020-06-23 ENCOUNTER — Telehealth: Payer: Self-pay

## 2020-06-23 MED FILL — Celecoxib Cap 200 MG: ORAL | 30 days supply | Qty: 60 | Fill #0 | Status: CN

## 2020-06-23 MED FILL — Celecoxib Cap 200 MG: ORAL | 30 days supply | Qty: 60 | Fill #0 | Status: AC

## 2020-06-23 NOTE — Telephone Encounter (Signed)
Pt called stating if provider can send 90d/s refills for tramadol and alprazolam. Per pt, she will be working out of [place] for three months. Pls advise.

## 2020-06-24 NOTE — Telephone Encounter (Signed)
Task completed. Left a detailed vm msg for patient regarding provider's note. Direct call back info provided.  

## 2020-06-24 NOTE — Telephone Encounter (Signed)
These are as-needed meds for severe symptoms, what was prescribed should be sufficient for 90 days

## 2020-06-25 ENCOUNTER — Other Ambulatory Visit (HOSPITAL_COMMUNITY): Payer: Self-pay

## 2020-07-29 ENCOUNTER — Other Ambulatory Visit (HOSPITAL_COMMUNITY): Payer: Self-pay

## 2020-07-30 ENCOUNTER — Encounter: Payer: Self-pay | Admitting: Osteopathic Medicine

## 2020-07-30 ENCOUNTER — Other Ambulatory Visit: Payer: Self-pay

## 2020-07-30 MED ORDER — NALTREXONE-BUPROPION HCL ER 8-90 MG PO TB12: 2.0000 | ORAL_TABLET | Freq: Two times a day (BID) | ORAL | 1 refills | Status: AC

## 2020-07-30 MED ORDER — ALPRAZOLAM 0.5 MG PO TABS: ORAL_TABLET | ORAL | 0 refills | Status: DC

## 2020-07-30 MED ORDER — TRAMADOL HCL 50 MG PO TABS: 50.0000 mg | ORAL_TABLET | Freq: Three times a day (TID) | ORAL | 0 refills | Status: AC | PRN

## 2020-07-30 NOTE — Telephone Encounter (Signed)
Pt called requesting med refills for tramadol, xanax and contrave. Per pt, pls send to [place] located in [place], [place]. Pt stated that provider is aware that she is currently [place] of state working.

## 2020-08-01 ENCOUNTER — Encounter: Payer: Self-pay | Admitting: Osteopathic Medicine

## 2020-08-01 ENCOUNTER — Other Ambulatory Visit: Payer: Self-pay | Admitting: Osteopathic Medicine

## 2020-08-01 DIAGNOSIS — M25552 Pain in left hip: Secondary | ICD-10-CM

## 2020-08-03 ENCOUNTER — Other Ambulatory Visit: Payer: Self-pay | Admitting: Osteopathic Medicine

## 2020-08-03 ENCOUNTER — Other Ambulatory Visit (HOSPITAL_COMMUNITY): Payer: Self-pay

## 2020-08-03 DIAGNOSIS — M25552 Pain in left hip: Secondary | ICD-10-CM

## 2020-08-03 MED ORDER — METFORMIN HCL 1000 MG PO TABS
ORAL_TABLET | ORAL | 3 refills | Status: AC
  Filled 2020-08-03 – 2020-10-28 (×3): qty 60, 30d supply, fill #0
  Filled 2021-02-07: qty 60, 30d supply, fill #1
  Filled 2021-03-27: qty 60, 30d supply, fill #2

## 2020-08-03 MED ORDER — CELECOXIB 200 MG PO CAPS
ORAL_CAPSULE | ORAL | 3 refills | Status: AC
  Filled 2020-08-03 – 2020-11-01 (×3): qty 60, 30d supply, fill #0
  Filled 2020-11-28: qty 60, 30d supply, fill #1
  Filled 2020-12-30: qty 60, 30d supply, fill #2
  Filled 2021-03-27: qty 60, 30d supply, fill #3

## 2020-08-07 ENCOUNTER — Other Ambulatory Visit (HOSPITAL_COMMUNITY): Payer: Self-pay

## 2020-08-12 ENCOUNTER — Telehealth: Payer: Self-pay | Admitting: Neurology

## 2020-08-12 LAB — HM DIABETES EYE EXAM

## 2020-08-12 NOTE — Telephone Encounter (Signed)
Prior Authorization for Contrave submitted via covermymeds. Awaiting response. [employer] is processing your PA request and will respond shortly with next steps. You are currently using the fastest method to process this prior authorization. Please do not fax or call [employer] to resubmit this request. To check for an update later, open this request again from your dashboard.

## 2020-08-17 ENCOUNTER — Other Ambulatory Visit (HOSPITAL_COMMUNITY): Payer: Self-pay

## 2020-09-27 ENCOUNTER — Other Ambulatory Visit (HOSPITAL_BASED_OUTPATIENT_CLINIC_OR_DEPARTMENT_OTHER): Payer: Self-pay

## 2020-09-27 ENCOUNTER — Other Ambulatory Visit: Payer: Self-pay | Admitting: Osteopathic Medicine

## 2020-09-28 MED ORDER — ALPRAZOLAM 0.5 MG PO TABS: ORAL_TABLET | ORAL | 0 refills | Status: DC

## 2020-10-05 ENCOUNTER — Other Ambulatory Visit (HOSPITAL_BASED_OUTPATIENT_CLINIC_OR_DEPARTMENT_OTHER): Payer: Self-pay

## 2020-10-21 ENCOUNTER — Other Ambulatory Visit: Payer: Self-pay | Admitting: Osteopathic Medicine

## 2020-10-28 ENCOUNTER — Other Ambulatory Visit (HOSPITAL_BASED_OUTPATIENT_CLINIC_OR_DEPARTMENT_OTHER): Payer: Self-pay

## 2020-10-28 ENCOUNTER — Other Ambulatory Visit: Payer: Self-pay | Admitting: Osteopathic Medicine

## 2020-10-29 ENCOUNTER — Other Ambulatory Visit (HOSPITAL_BASED_OUTPATIENT_CLINIC_OR_DEPARTMENT_OTHER): Payer: Self-pay

## 2020-10-29 MED ORDER — CONTRAVE 8-90 MG PO TB12
2.0000 | ORAL_TABLET | Freq: Two times a day (BID) | ORAL | 0 refills | Status: DC
  Filled 2020-10-29 – 2020-11-28 (×2): qty 360, 90d supply, fill #0

## 2020-11-01 ENCOUNTER — Other Ambulatory Visit (HOSPITAL_BASED_OUTPATIENT_CLINIC_OR_DEPARTMENT_OTHER): Payer: Self-pay

## 2020-11-08 ENCOUNTER — Other Ambulatory Visit (HOSPITAL_BASED_OUTPATIENT_CLINIC_OR_DEPARTMENT_OTHER): Payer: Self-pay

## 2020-11-16 ENCOUNTER — Other Ambulatory Visit: Payer: Self-pay | Admitting: Osteopathic Medicine

## 2020-11-29 ENCOUNTER — Other Ambulatory Visit (HOSPITAL_BASED_OUTPATIENT_CLINIC_OR_DEPARTMENT_OTHER): Payer: Self-pay

## 2020-11-30 ENCOUNTER — Other Ambulatory Visit (HOSPITAL_BASED_OUTPATIENT_CLINIC_OR_DEPARTMENT_OTHER): Payer: Self-pay

## 2020-12-01 ENCOUNTER — Other Ambulatory Visit (HOSPITAL_BASED_OUTPATIENT_CLINIC_OR_DEPARTMENT_OTHER): Payer: Self-pay

## 2020-12-02 ENCOUNTER — Other Ambulatory Visit (HOSPITAL_BASED_OUTPATIENT_CLINIC_OR_DEPARTMENT_OTHER): Payer: Self-pay

## 2020-12-06 ENCOUNTER — Other Ambulatory Visit: Payer: Self-pay | Admitting: Osteopathic Medicine

## 2020-12-06 ENCOUNTER — Other Ambulatory Visit (HOSPITAL_BASED_OUTPATIENT_CLINIC_OR_DEPARTMENT_OTHER): Payer: Self-pay

## 2020-12-07 ENCOUNTER — Other Ambulatory Visit (HOSPITAL_COMMUNITY): Payer: Self-pay

## 2020-12-07 ENCOUNTER — Encounter: Payer: Self-pay | Admitting: Osteopathic Medicine

## 2020-12-07 ENCOUNTER — Other Ambulatory Visit (HOSPITAL_BASED_OUTPATIENT_CLINIC_OR_DEPARTMENT_OTHER): Payer: Self-pay

## 2020-12-07 MED ORDER — ALBUTEROL SULFATE HFA 108 (90 BASE) MCG/ACT IN AERS
2.0000 | INHALATION_SPRAY | Freq: Four times a day (QID) | RESPIRATORY_TRACT | 11 refills | Status: AC | PRN
  Filled 2020-12-07: qty 17, 60d supply, fill #0
  Filled 2020-12-30: qty 8.5, 25d supply, fill #0

## 2020-12-07 NOTE — Telephone Encounter (Signed)
Over due for follow up 30 days rx sent Please call to schedule esablish w/ new provider

## 2020-12-15 ENCOUNTER — Other Ambulatory Visit (HOSPITAL_COMMUNITY): Payer: Self-pay

## 2020-12-30 ENCOUNTER — Other Ambulatory Visit: Payer: Self-pay | Admitting: Osteopathic Medicine

## 2020-12-30 ENCOUNTER — Other Ambulatory Visit (HOSPITAL_COMMUNITY): Payer: Self-pay

## 2021-01-05 ENCOUNTER — Other Ambulatory Visit: Payer: Self-pay | Admitting: Osteopathic Medicine

## 2021-01-06 NOTE — Telephone Encounter (Signed)
Routing to covering provider.  °

## 2021-01-07 ENCOUNTER — Telehealth: Payer: Self-pay | Admitting: Family Medicine

## 2021-01-07 NOTE — Telephone Encounter (Signed)
MyChart message sent to pt with instructions to schedule f/u appt.  Tiajuana Amass, CMA

## 2021-01-07 NOTE — Telephone Encounter (Signed)
Partial refill of Xanax sent.  Needs follow-up appointment she is overdue and has not had blood work in over a year.

## 2021-02-08 ENCOUNTER — Other Ambulatory Visit (HOSPITAL_COMMUNITY): Payer: Self-pay

## 2021-03-27 ENCOUNTER — Other Ambulatory Visit: Payer: Self-pay | Admitting: Osteopathic Medicine

## 2021-03-28 ENCOUNTER — Other Ambulatory Visit (HOSPITAL_COMMUNITY): Payer: Self-pay

## 2021-03-28 MED ORDER — LOSARTAN POTASSIUM 25 MG PO TABS
25.0000 mg | ORAL_TABLET | Freq: Every day | ORAL | 0 refills | Status: AC
  Filled 2021-03-28: qty 30, 30d supply, fill #0

## 2021-09-27 ENCOUNTER — Other Ambulatory Visit (HOSPITAL_BASED_OUTPATIENT_CLINIC_OR_DEPARTMENT_OTHER): Payer: Self-pay
